# Patient Record
Sex: Female | Born: 1945
Health system: Southern US, Community
[De-identification: ages and names within clinical notes are randomized; demographics above are authoritative.]

## PROBLEM LIST (undated history)

## (undated) DIAGNOSIS — M25511 Pain in right shoulder: Secondary | ICD-10-CM

## (undated) DIAGNOSIS — T753XXA Motion sickness, initial encounter: Secondary | ICD-10-CM

## (undated) DIAGNOSIS — M1990 Unspecified osteoarthritis, unspecified site: Secondary | ICD-10-CM

## (undated) DIAGNOSIS — C801 Malignant (primary) neoplasm, unspecified: Secondary | ICD-10-CM

## (undated) DIAGNOSIS — R42 Dizziness and giddiness: Secondary | ICD-10-CM

## (undated) DIAGNOSIS — K219 Gastro-esophageal reflux disease without esophagitis: Secondary | ICD-10-CM

## (undated) DIAGNOSIS — M810 Age-related osteoporosis without current pathological fracture: Secondary | ICD-10-CM

## (undated) HISTORY — PX: FOOT SURGERY: SHX648

## (undated) HISTORY — DX: Malignant (primary) neoplasm, unspecified: C80.1

## (undated) HISTORY — PX: ABDOMINAL HYSTERECTOMY: SHX81

## (undated) HISTORY — PX: TONSILLECTOMY AND ADENOIDECTOMY: SHX28

## (undated) HISTORY — PX: EYE SURGERY: SHX253

---

## 1979-03-24 HISTORY — PX: AMPUTATION TOE: SHX6595

## 1998-10-14 ENCOUNTER — Ambulatory Visit (HOSPITAL_COMMUNITY): Admission: RE | Admit: 1998-10-14 | Discharge: 1998-10-14 | Payer: Self-pay | Admitting: Gastroenterology

## 1999-10-19 ENCOUNTER — Encounter: Admission: RE | Admit: 1999-10-19 | Discharge: 1999-10-19 | Payer: Self-pay | Admitting: *Deleted

## 2000-01-12 ENCOUNTER — Encounter: Payer: Self-pay | Admitting: Family Medicine

## 2000-01-12 ENCOUNTER — Encounter: Admission: RE | Admit: 2000-01-12 | Discharge: 2000-01-12 | Payer: Self-pay | Admitting: Family Medicine

## 2001-01-14 ENCOUNTER — Encounter: Payer: Self-pay | Admitting: Family Medicine

## 2001-01-14 ENCOUNTER — Encounter: Admission: RE | Admit: 2001-01-14 | Discharge: 2001-01-14 | Payer: Self-pay | Admitting: Family Medicine

## 2001-02-06 ENCOUNTER — Other Ambulatory Visit: Admission: RE | Admit: 2001-02-06 | Discharge: 2001-02-06 | Payer: Self-pay | Admitting: Obstetrics and Gynecology

## 2002-01-16 ENCOUNTER — Encounter: Payer: Self-pay | Admitting: Family Medicine

## 2002-01-16 ENCOUNTER — Encounter: Admission: RE | Admit: 2002-01-16 | Discharge: 2002-01-16 | Payer: Self-pay | Admitting: Family Medicine

## 2002-02-03 ENCOUNTER — Other Ambulatory Visit: Admission: RE | Admit: 2002-02-03 | Discharge: 2002-02-03 | Payer: Self-pay | Admitting: Obstetrics and Gynecology

## 2003-01-22 ENCOUNTER — Encounter: Payer: Self-pay | Admitting: Family Medicine

## 2003-01-22 ENCOUNTER — Encounter: Admission: RE | Admit: 2003-01-22 | Discharge: 2003-01-22 | Payer: Self-pay | Admitting: Family Medicine

## 2003-04-28 ENCOUNTER — Observation Stay (HOSPITAL_COMMUNITY): Admission: EM | Admit: 2003-04-28 | Discharge: 2003-04-29 | Payer: Self-pay

## 2003-04-29 ENCOUNTER — Encounter: Payer: Self-pay | Admitting: Interventional Cardiology

## 2004-02-07 ENCOUNTER — Encounter: Admission: RE | Admit: 2004-02-07 | Discharge: 2004-02-07 | Payer: Self-pay | Admitting: Cardiology

## 2005-02-23 ENCOUNTER — Encounter: Admission: RE | Admit: 2005-02-23 | Discharge: 2005-02-23 | Payer: Self-pay | Admitting: Family Medicine

## 2006-02-26 ENCOUNTER — Encounter: Admission: RE | Admit: 2006-02-26 | Discharge: 2006-02-26 | Payer: Self-pay | Admitting: Family Medicine

## 2007-03-03 ENCOUNTER — Encounter: Admission: RE | Admit: 2007-03-03 | Discharge: 2007-03-03 | Payer: Self-pay | Admitting: Family Medicine

## 2008-03-02 ENCOUNTER — Encounter: Admission: RE | Admit: 2008-03-02 | Discharge: 2008-03-02 | Payer: Self-pay | Admitting: Family Medicine

## 2008-03-11 ENCOUNTER — Encounter: Admission: RE | Admit: 2008-03-11 | Discharge: 2008-03-11 | Payer: Self-pay | Admitting: Obstetrics & Gynecology

## 2009-03-14 ENCOUNTER — Encounter: Admission: RE | Admit: 2009-03-14 | Discharge: 2009-03-14 | Payer: Self-pay | Admitting: Family Medicine

## 2009-07-08 ENCOUNTER — Encounter: Admission: RE | Admit: 2009-07-08 | Discharge: 2009-07-08 | Payer: Self-pay | Admitting: Podiatry

## 2010-04-13 ENCOUNTER — Encounter: Admission: RE | Admit: 2010-04-13 | Discharge: 2010-04-13 | Payer: Self-pay | Admitting: Family Medicine

## 2010-12-08 NOTE — Discharge Summary (Signed)
NAMEMAYLEE, BARE                            ACCOUNT NO.:  [id number]   MEDICAL RECORD NO.:  [id number]                   PATIENT TYPE:  INP   LOCATION:  2038                                 FACILITY:  [facility]   PHYSICIAN:  Meade Maw, M.D.                 DATE OF BIRTH:  [birth year]   DATE OF ADMISSION:  04/28/2003  DATE OF DISCHARGE:  04/29/2003                                 DISCHARGE SUMMARY   CHIEF COMPLAINT:  Chest pain.   ADMISSION DIAGNOSES:  1. Chest pain, rule out myocardial infarction.  2. Hypertension.   DISCHARGE DIAGNOSES:  1. Chest pain, non-cardiac, negative for myocardial infarction.  2. Hypertension, controlled.   HISTORY OF PRESENT ILLNESS:  Please see complete H&P for details, but in  short, this is a 65 year old female with no known history of CAD previously.  She presented to the emergency room on April 28, 2003 with a complaint of  substernal chest pressure that radiated up into both sides of her jaw.  The  pain persisted and she presented to the emergency room for further treatment  and evaluation.   PHYSICAL EXAM ON ADMISSION:  Please see complete H&P.  Vital signs were  stable, with the exception of mildly elevated blood pressure at 160/70.  EKG  showed a normal sinus rhythm with no ST or T wave changes.  Cardiac markers  in the emergency room were negative x2 sets.  She was afebrile.  She was  pain-free upon admission.   HOSPITAL COURSE:  Repeat serial cardiac enzymes were all negative.  EKG had  no acute changes.  She underwent a stress Cardiolite study on April 29, 2003 which showed no ischemia and normal LV function with an EF of 74%.  Her  vital signs remained stable and she had no further complaints of chest  discomfort during this admission.  She was discharged home on April 29, 2003 without further problems.   DISCHARGE MEDICATIONS:  1. Actonel at her previous dose.  2. B12 injections monthly.  3. Claritin as needed (p.r.n.).  4. Flonase p.r.n.  5. Prilosec 20 mg daily.  6. Aspirin 81 mg daily.   ACTIVITY:  Activity as tolerated with no restrictions.   DIET:  She is to maintain a low-fat, low-cholesterol diet.  She was  instructed not to eat less than two hours before bedtime.    FOLLOWUP:  She is to make an appointment to see Dr. Caryn Bee L. Little for  followup in approximately one to two weeks.  She may return to see Dr. Meade Maw on a p.r.n. basis.       Adrian Saran, N.P.                        Meade Maw, M.D.    HB/MEDQ  D:  04/29/2003  T:  04/30/2003  Job:  [id number]  

## 2011-04-02 ENCOUNTER — Other Ambulatory Visit: Payer: Self-pay | Admitting: Family Medicine

## 2011-04-02 DIAGNOSIS — Z1231 Encounter for screening mammogram for malignant neoplasm of breast: Secondary | ICD-10-CM

## 2011-04-24 ENCOUNTER — Ambulatory Visit
Admission: RE | Admit: 2011-04-24 | Discharge: 2011-04-24 | Disposition: A | Discharge status: Home / Self Care | Payer: BC Managed Care – PPO | Source: Ambulatory Visit | Attending: Family Medicine | Admitting: Family Medicine

## 2011-04-24 DIAGNOSIS — Z1231 Encounter for screening mammogram for malignant neoplasm of breast: Secondary | ICD-10-CM

## 2012-03-18 ENCOUNTER — Other Ambulatory Visit: Payer: Self-pay | Admitting: Family Medicine

## 2012-03-18 DIAGNOSIS — Z1231 Encounter for screening mammogram for malignant neoplasm of breast: Secondary | ICD-10-CM

## 2012-03-18 DIAGNOSIS — M810 Age-related osteoporosis without current pathological fracture: Secondary | ICD-10-CM

## 2012-04-29 ENCOUNTER — Ambulatory Visit
Admission: RE | Admit: 2012-04-29 | Discharge: 2012-04-29 | Disposition: A | Discharge status: Home / Self Care | Payer: BC Managed Care – PPO | Source: Ambulatory Visit | Attending: Family Medicine | Admitting: Family Medicine

## 2012-04-29 ENCOUNTER — Ambulatory Visit: Payer: BC Managed Care – PPO

## 2012-04-29 DIAGNOSIS — Z1231 Encounter for screening mammogram for malignant neoplasm of breast: Secondary | ICD-10-CM

## 2012-04-29 DIAGNOSIS — M810 Age-related osteoporosis without current pathological fracture: Secondary | ICD-10-CM

## 2012-08-01 DIAGNOSIS — D649 Anemia, unspecified: Secondary | ICD-10-CM | POA: Diagnosis not present

## 2012-08-01 DIAGNOSIS — Z79899 Other long term (current) drug therapy: Secondary | ICD-10-CM | POA: Diagnosis not present

## 2012-08-01 DIAGNOSIS — E785 Hyperlipidemia, unspecified: Secondary | ICD-10-CM | POA: Diagnosis not present

## 2012-08-01 DIAGNOSIS — M810 Age-related osteoporosis without current pathological fracture: Secondary | ICD-10-CM | POA: Diagnosis not present

## 2012-08-01 DIAGNOSIS — D510 Vitamin B12 deficiency anemia due to intrinsic factor deficiency: Secondary | ICD-10-CM | POA: Diagnosis not present

## 2012-08-07 DIAGNOSIS — L600 Ingrowing nail: Secondary | ICD-10-CM | POA: Diagnosis not present

## 2012-08-19 DIAGNOSIS — Z79899 Other long term (current) drug therapy: Secondary | ICD-10-CM | POA: Diagnosis not present

## 2012-08-19 DIAGNOSIS — M810 Age-related osteoporosis without current pathological fracture: Secondary | ICD-10-CM | POA: Diagnosis not present

## 2012-08-19 DIAGNOSIS — E785 Hyperlipidemia, unspecified: Secondary | ICD-10-CM | POA: Diagnosis not present

## 2012-08-19 DIAGNOSIS — D510 Vitamin B12 deficiency anemia due to intrinsic factor deficiency: Secondary | ICD-10-CM | POA: Diagnosis not present

## 2012-08-19 DIAGNOSIS — Z23 Encounter for immunization: Secondary | ICD-10-CM | POA: Diagnosis not present

## 2012-08-19 DIAGNOSIS — D649 Anemia, unspecified: Secondary | ICD-10-CM | POA: Diagnosis not present

## 2012-10-08 DIAGNOSIS — R609 Edema, unspecified: Secondary | ICD-10-CM | POA: Diagnosis not present

## 2012-10-08 DIAGNOSIS — L820 Inflamed seborrheic keratosis: Secondary | ICD-10-CM | POA: Diagnosis not present

## 2012-10-08 DIAGNOSIS — IMO0002 Reserved for concepts with insufficient information to code with codable children: Secondary | ICD-10-CM | POA: Diagnosis not present

## 2012-11-03 DIAGNOSIS — L03039 Cellulitis of unspecified toe: Secondary | ICD-10-CM | POA: Diagnosis not present

## 2012-11-24 DIAGNOSIS — L820 Inflamed seborrheic keratosis: Secondary | ICD-10-CM | POA: Diagnosis not present

## 2013-03-30 DIAGNOSIS — L538 Other specified erythematous conditions: Secondary | ICD-10-CM | POA: Diagnosis not present

## 2013-03-30 DIAGNOSIS — Z85828 Personal history of other malignant neoplasm of skin: Secondary | ICD-10-CM | POA: Diagnosis not present

## 2013-03-30 DIAGNOSIS — L570 Actinic keratosis: Secondary | ICD-10-CM | POA: Diagnosis not present

## 2013-03-30 DIAGNOSIS — D235 Other benign neoplasm of skin of trunk: Secondary | ICD-10-CM | POA: Diagnosis not present

## 2013-04-10 DIAGNOSIS — Z23 Encounter for immunization: Secondary | ICD-10-CM | POA: Diagnosis not present

## 2013-05-14 ENCOUNTER — Other Ambulatory Visit: Payer: Self-pay

## 2013-05-14 DIAGNOSIS — Z1231 Encounter for screening mammogram for malignant neoplasm of breast: Secondary | ICD-10-CM

## 2013-05-14 DIAGNOSIS — Z124 Encounter for screening for malignant neoplasm of cervix: Secondary | ICD-10-CM | POA: Diagnosis not present

## 2013-06-15 ENCOUNTER — Ambulatory Visit
Admission: RE | Admit: 2013-06-15 | Discharge: 2013-06-15 | Disposition: A | Discharge status: Home / Self Care | Payer: Medicare Other | Source: Ambulatory Visit

## 2013-06-15 DIAGNOSIS — Z1231 Encounter for screening mammogram for malignant neoplasm of breast: Secondary | ICD-10-CM | POA: Diagnosis not present

## 2013-06-17 DIAGNOSIS — H01009 Unspecified blepharitis unspecified eye, unspecified eyelid: Secondary | ICD-10-CM | POA: Diagnosis not present

## 2013-08-19 DIAGNOSIS — L820 Inflamed seborrheic keratosis: Secondary | ICD-10-CM | POA: Diagnosis not present

## 2013-08-19 DIAGNOSIS — I781 Nevus, non-neoplastic: Secondary | ICD-10-CM | POA: Diagnosis not present

## 2013-08-19 DIAGNOSIS — L570 Actinic keratosis: Secondary | ICD-10-CM | POA: Diagnosis not present

## 2013-09-04 DIAGNOSIS — Z1331 Encounter for screening for depression: Secondary | ICD-10-CM | POA: Diagnosis not present

## 2013-09-04 DIAGNOSIS — D510 Vitamin B12 deficiency anemia due to intrinsic factor deficiency: Secondary | ICD-10-CM | POA: Diagnosis not present

## 2013-09-04 DIAGNOSIS — Z79899 Other long term (current) drug therapy: Secondary | ICD-10-CM | POA: Diagnosis not present

## 2013-09-04 DIAGNOSIS — E785 Hyperlipidemia, unspecified: Secondary | ICD-10-CM | POA: Diagnosis not present

## 2013-09-04 DIAGNOSIS — Z23 Encounter for immunization: Secondary | ICD-10-CM | POA: Diagnosis not present

## 2013-09-04 DIAGNOSIS — M810 Age-related osteoporosis without current pathological fracture: Secondary | ICD-10-CM | POA: Diagnosis not present

## 2014-01-06 DIAGNOSIS — L570 Actinic keratosis: Secondary | ICD-10-CM | POA: Diagnosis not present

## 2014-01-06 DIAGNOSIS — L820 Inflamed seborrheic keratosis: Secondary | ICD-10-CM | POA: Diagnosis not present

## 2014-02-05 DIAGNOSIS — M67919 Unspecified disorder of synovium and tendon, unspecified shoulder: Secondary | ICD-10-CM | POA: Diagnosis not present

## 2014-02-05 DIAGNOSIS — M7541 Impingement syndrome of right shoulder: Secondary | ICD-10-CM | POA: Insufficient documentation

## 2014-02-05 DIAGNOSIS — M25519 Pain in unspecified shoulder: Secondary | ICD-10-CM | POA: Diagnosis not present

## 2014-02-11 DIAGNOSIS — M25519 Pain in unspecified shoulder: Secondary | ICD-10-CM | POA: Diagnosis not present

## 2014-02-15 DIAGNOSIS — M25519 Pain in unspecified shoulder: Secondary | ICD-10-CM | POA: Diagnosis not present

## 2014-02-18 DIAGNOSIS — M25519 Pain in unspecified shoulder: Secondary | ICD-10-CM | POA: Diagnosis not present

## 2014-03-01 DIAGNOSIS — M25519 Pain in unspecified shoulder: Secondary | ICD-10-CM | POA: Diagnosis not present

## 2014-03-04 DIAGNOSIS — M25519 Pain in unspecified shoulder: Secondary | ICD-10-CM | POA: Diagnosis not present

## 2014-03-08 DIAGNOSIS — M25519 Pain in unspecified shoulder: Secondary | ICD-10-CM | POA: Diagnosis not present

## 2014-03-10 DIAGNOSIS — M25519 Pain in unspecified shoulder: Secondary | ICD-10-CM | POA: Diagnosis not present

## 2014-03-26 DIAGNOSIS — M67919 Unspecified disorder of synovium and tendon, unspecified shoulder: Secondary | ICD-10-CM | POA: Diagnosis not present

## 2014-03-26 DIAGNOSIS — M719 Bursopathy, unspecified: Secondary | ICD-10-CM | POA: Diagnosis not present

## 2014-04-26 DIAGNOSIS — L820 Inflamed seborrheic keratosis: Secondary | ICD-10-CM | POA: Diagnosis not present

## 2014-04-26 DIAGNOSIS — L570 Actinic keratosis: Secondary | ICD-10-CM | POA: Diagnosis not present

## 2014-04-26 DIAGNOSIS — X32XXXD Exposure to sunlight, subsequent encounter: Secondary | ICD-10-CM | POA: Diagnosis not present

## 2014-04-29 DIAGNOSIS — Z23 Encounter for immunization: Secondary | ICD-10-CM | POA: Diagnosis not present

## 2014-05-10 ENCOUNTER — Other Ambulatory Visit: Payer: Self-pay

## 2014-05-10 DIAGNOSIS — Z1231 Encounter for screening mammogram for malignant neoplasm of breast: Secondary | ICD-10-CM

## 2014-05-11 ENCOUNTER — Other Ambulatory Visit: Payer: Self-pay | Admitting: Family Medicine

## 2014-05-11 DIAGNOSIS — M810 Age-related osteoporosis without current pathological fracture: Secondary | ICD-10-CM

## 2014-05-25 DIAGNOSIS — Z124 Encounter for screening for malignant neoplasm of cervix: Secondary | ICD-10-CM | POA: Diagnosis not present

## 2014-05-25 DIAGNOSIS — Z01419 Encounter for gynecological examination (general) (routine) without abnormal findings: Secondary | ICD-10-CM | POA: Diagnosis not present

## 2014-06-21 DIAGNOSIS — H2513 Age-related nuclear cataract, bilateral: Secondary | ICD-10-CM | POA: Diagnosis not present

## 2014-06-22 ENCOUNTER — Ambulatory Visit
Admission: RE | Admit: 2014-06-22 | Discharge: 2014-06-22 | Disposition: A | Discharge status: Home / Self Care | Payer: Medicare Other | Source: Ambulatory Visit | Attending: Family Medicine | Admitting: Family Medicine

## 2014-06-22 ENCOUNTER — Ambulatory Visit
Admission: RE | Admit: 2014-06-22 | Discharge: 2014-06-22 | Disposition: A | Discharge status: Home / Self Care | Payer: Medicare Other | Source: Ambulatory Visit

## 2014-06-22 DIAGNOSIS — M810 Age-related osteoporosis without current pathological fracture: Secondary | ICD-10-CM | POA: Diagnosis not present

## 2014-06-22 DIAGNOSIS — Z1231 Encounter for screening mammogram for malignant neoplasm of breast: Secondary | ICD-10-CM | POA: Diagnosis not present

## 2014-06-22 DIAGNOSIS — Z780 Asymptomatic menopausal state: Secondary | ICD-10-CM | POA: Diagnosis not present

## 2014-06-30 DIAGNOSIS — L820 Inflamed seborrheic keratosis: Secondary | ICD-10-CM | POA: Diagnosis not present

## 2014-06-30 DIAGNOSIS — L723 Sebaceous cyst: Secondary | ICD-10-CM | POA: Diagnosis not present

## 2014-07-21 DIAGNOSIS — L820 Inflamed seborrheic keratosis: Secondary | ICD-10-CM | POA: Diagnosis not present

## 2014-07-23 DIAGNOSIS — C801 Malignant (primary) neoplasm, unspecified: Secondary | ICD-10-CM

## 2014-07-23 HISTORY — DX: Malignant (primary) neoplasm, unspecified: C80.1

## 2014-09-16 DIAGNOSIS — C44109 Unspecified malignant neoplasm of skin of left eyelid, including canthus: Secondary | ICD-10-CM | POA: Diagnosis not present

## 2014-09-22 DIAGNOSIS — L820 Inflamed seborrheic keratosis: Secondary | ICD-10-CM | POA: Diagnosis not present

## 2014-11-19 DIAGNOSIS — M19041 Primary osteoarthritis, right hand: Secondary | ICD-10-CM | POA: Diagnosis not present

## 2014-11-19 DIAGNOSIS — M7541 Impingement syndrome of right shoulder: Secondary | ICD-10-CM | POA: Diagnosis not present

## 2014-11-19 DIAGNOSIS — M25531 Pain in right wrist: Secondary | ICD-10-CM | POA: Diagnosis not present

## 2014-11-19 DIAGNOSIS — M25511 Pain in right shoulder: Secondary | ICD-10-CM | POA: Diagnosis not present

## 2014-11-25 DIAGNOSIS — C44109 Unspecified malignant neoplasm of skin of left eyelid, including canthus: Secondary | ICD-10-CM | POA: Diagnosis not present

## 2014-11-25 DIAGNOSIS — C44119 Basal cell carcinoma of skin of left eyelid, including canthus: Secondary | ICD-10-CM | POA: Diagnosis not present

## 2014-12-08 DIAGNOSIS — L570 Actinic keratosis: Secondary | ICD-10-CM | POA: Diagnosis not present

## 2014-12-08 DIAGNOSIS — R0609 Other forms of dyspnea: Secondary | ICD-10-CM | POA: Diagnosis not present

## 2014-12-08 DIAGNOSIS — R5383 Other fatigue: Secondary | ICD-10-CM | POA: Diagnosis not present

## 2014-12-08 DIAGNOSIS — Z1283 Encounter for screening for malignant neoplasm of skin: Secondary | ICD-10-CM | POA: Diagnosis not present

## 2014-12-08 DIAGNOSIS — M810 Age-related osteoporosis without current pathological fracture: Secondary | ICD-10-CM | POA: Diagnosis not present

## 2014-12-08 DIAGNOSIS — N289 Disorder of kidney and ureter, unspecified: Secondary | ICD-10-CM | POA: Diagnosis not present

## 2014-12-08 DIAGNOSIS — E785 Hyperlipidemia, unspecified: Secondary | ICD-10-CM | POA: Diagnosis not present

## 2014-12-08 DIAGNOSIS — X32XXXD Exposure to sunlight, subsequent encounter: Secondary | ICD-10-CM | POA: Diagnosis not present

## 2014-12-15 ENCOUNTER — Encounter: Payer: Self-pay | Admitting: Podiatry

## 2014-12-15 ENCOUNTER — Ambulatory Visit (INDEPENDENT_AMBULATORY_CARE_PROVIDER_SITE_OTHER): Payer: Medicare Other

## 2014-12-15 ENCOUNTER — Ambulatory Visit (INDEPENDENT_AMBULATORY_CARE_PROVIDER_SITE_OTHER): Payer: Medicare Other | Admitting: Podiatry

## 2014-12-15 VITALS — BP 112/54 | HR 78 | Resp 11 | Ht 68.0 in | Wt 160.0 lb

## 2014-12-15 DIAGNOSIS — L600 Ingrowing nail: Secondary | ICD-10-CM

## 2014-12-15 DIAGNOSIS — M21612 Bunion of left foot: Secondary | ICD-10-CM

## 2014-12-15 DIAGNOSIS — M2012 Hallux valgus (acquired), left foot: Secondary | ICD-10-CM

## 2014-12-15 DIAGNOSIS — M779 Enthesopathy, unspecified: Secondary | ICD-10-CM | POA: Diagnosis not present

## 2014-12-15 MED ORDER — TRIAMCINOLONE ACETONIDE 10 MG/ML IJ SUSP
10.0000 mg | Freq: Once | INTRAMUSCULAR | Status: AC
  Administered 2014-12-15: 10 mg

## 2014-12-15 NOTE — Progress Notes (Signed)
   Subjective:    Patient ID: Kelly Keith, female    DOB: March 22, 1946, 69 y.o.   MRN: [id number]  HPI    Review of Systems  Eyes:       Left lower eye lid cancerous biopsy  Genitourinary:       Current bloodwork showed abnormal kidney function and pt states she is to avoid ANSAIDS.  All other systems reviewed and are negative.      Objective:   Physical Exam        Assessment & Plan:

## 2014-12-15 NOTE — Patient Instructions (Signed)

## 2014-12-16 NOTE — Progress Notes (Signed)
Subjective:     Patient ID: Kelly Keith, female   DOB: 10-Apr-1946, 69 y.o.   MRN: [id number]  HPI patient presents stating she's getting pain in her left big toe and the joint and she has severely damaged and painful hallux and second nailbeds left that are abnormally growing thick and impossible for her to cut   Review of Systems  All other systems reviewed and are negative.      Objective:   Physical Exam  Constitutional: She is oriented to person, place, and time.  Cardiovascular: Intact distal pulses.   Musculoskeletal: Normal range of motion.  Neurological: She is oriented to person, place, and time.  Skin: Skin is warm.  Nursing note and vitals reviewed.  neurovascular status intact muscle strength adequate with range of motion within normal limits. Patient's found to have good digital perfusion is well oriented 3 and has severe damage to the left hallux and second nailbeds with inflammation of the interphalangeal joint of the left first hallux. There is a mild amount of fluid buildup but no crepitus upon motion of the interphalangeal joint     Assessment:     Damaged nailbeds hallux and second left with thickness and dystrophic changes with pain and inner phalangeal joint inflammation pain left hallux    Plan:     H&P and conditions reviewed with patient. At this point due to the severe nature of the nailbeds are recommended removal of the permanent nature and explained procedure and risk. Patient wants surgery and today I infiltrated the hallux and second toe with 120 mg Xylocaine Marcaine mixture I then went ahead and did a interphalangeal joint injection left and then remove the hallux and second nails exposed matrix and applied phenol 5 applications 30 seconds followed by alcohol lavage and sterile dressing. Gave instructions on soaks and reappoint

## 2014-12-21 ENCOUNTER — Encounter: Payer: Self-pay | Admitting: Podiatry

## 2014-12-21 ENCOUNTER — Ambulatory Visit (INDEPENDENT_AMBULATORY_CARE_PROVIDER_SITE_OTHER): Payer: Medicare Other | Admitting: Podiatry

## 2014-12-21 VITALS — BP 126/82 | HR 70 | Resp 12

## 2014-12-21 DIAGNOSIS — L03012 Cellulitis of left finger: Secondary | ICD-10-CM

## 2014-12-21 MED ORDER — CEPHALEXIN 500 MG PO CAPS: 500.0000 mg | ORAL_CAPSULE | Freq: Two times a day (BID) | ORAL | Status: DC

## 2014-12-21 NOTE — Progress Notes (Signed)
Subjective:     Patient ID: Kelly Keith, female   DOB: 11-03-1945, 69 y.o.   MRN: [id number]  HPI patient presents stating that these left hallux and second toe are draining and they're painful when pressed   Review of Systems     Objective:   Physical Exam Neurovascular status intact with health history normal and mild drainage on the hallux and left second nail bed that's localized in nature    Assessment:     Paronychia infection left hallux second nail that's localized in nature with no indications of proximal edema erythema or drainage    Plan:     Reviewed condition and at this time I've recommended continued soaks and his precautionary measure placed patient on cephalexin 500 mg twice a day and patient will call if any other issues were to occur

## 2014-12-21 NOTE — Progress Notes (Signed)
   Subjective:    Patient ID: Kelly Keith, female    DOB: 23-Oct-1945, 69 y.o.   MRN: [id number]  HPI  Patient has inflammation, redness and throbbing pain on Left first 2 toes. Particularly in entire areas surrounding the nail.  Review of Systems     Objective:   Physical Exam        Assessment & Plan:

## 2014-12-24 DIAGNOSIS — E785 Hyperlipidemia, unspecified: Secondary | ICD-10-CM | POA: Diagnosis not present

## 2014-12-24 DIAGNOSIS — R0602 Shortness of breath: Secondary | ICD-10-CM | POA: Diagnosis not present

## 2015-01-18 DIAGNOSIS — R0600 Dyspnea, unspecified: Secondary | ICD-10-CM | POA: Diagnosis not present

## 2015-01-21 DIAGNOSIS — R0609 Other forms of dyspnea: Secondary | ICD-10-CM | POA: Diagnosis not present

## 2015-02-12 DIAGNOSIS — N390 Urinary tract infection, site not specified: Secondary | ICD-10-CM | POA: Diagnosis not present

## 2015-04-20 ENCOUNTER — Other Ambulatory Visit: Payer: Self-pay | Admitting: Physician Assistant

## 2015-04-20 DIAGNOSIS — M7541 Impingement syndrome of right shoulder: Secondary | ICD-10-CM

## 2015-04-26 ENCOUNTER — Ambulatory Visit: Admission: RE | Admit: 2015-04-26 | Payer: Medicare Other | Source: Ambulatory Visit | Admitting: Ophthalmology

## 2015-04-26 ENCOUNTER — Encounter: Admission: RE | Payer: Self-pay | Source: Ambulatory Visit

## 2015-04-26 SURGERY — BLEPHAROPLASTY
Anesthesia: IV Sedation (MBSC Only) | Laterality: Bilateral

## 2015-04-28 ENCOUNTER — Ambulatory Visit
Admission: RE | Admit: 2015-04-28 | Discharge: 2015-04-28 | Disposition: A | Discharge status: Home / Self Care | Payer: Medicare Other | Source: Ambulatory Visit | Attending: Physician Assistant | Admitting: Physician Assistant

## 2015-04-28 DIAGNOSIS — M7541 Impingement syndrome of right shoulder: Secondary | ICD-10-CM | POA: Diagnosis not present

## 2015-04-28 DIAGNOSIS — M7581 Other shoulder lesions, right shoulder: Secondary | ICD-10-CM | POA: Diagnosis not present

## 2015-04-28 DIAGNOSIS — M25511 Pain in right shoulder: Secondary | ICD-10-CM | POA: Diagnosis not present

## 2015-05-11 DIAGNOSIS — L820 Inflamed seborrheic keratosis: Secondary | ICD-10-CM | POA: Diagnosis not present

## 2015-05-20 DIAGNOSIS — M47812 Spondylosis without myelopathy or radiculopathy, cervical region: Secondary | ICD-10-CM | POA: Insufficient documentation

## 2015-05-20 DIAGNOSIS — M7541 Impingement syndrome of right shoulder: Secondary | ICD-10-CM | POA: Diagnosis not present

## 2015-05-20 DIAGNOSIS — M7581 Other shoulder lesions, right shoulder: Secondary | ICD-10-CM | POA: Diagnosis not present

## 2015-05-30 DIAGNOSIS — M542 Cervicalgia: Secondary | ICD-10-CM | POA: Diagnosis not present

## 2015-06-01 DIAGNOSIS — M542 Cervicalgia: Secondary | ICD-10-CM | POA: Diagnosis not present

## 2015-06-13 DIAGNOSIS — Z01419 Encounter for gynecological examination (general) (routine) without abnormal findings: Secondary | ICD-10-CM | POA: Diagnosis not present

## 2015-06-13 DIAGNOSIS — Z124 Encounter for screening for malignant neoplasm of cervix: Secondary | ICD-10-CM | POA: Diagnosis not present

## 2015-06-13 DIAGNOSIS — Z23 Encounter for immunization: Secondary | ICD-10-CM | POA: Diagnosis not present

## 2015-06-13 DIAGNOSIS — Z1231 Encounter for screening mammogram for malignant neoplasm of breast: Secondary | ICD-10-CM | POA: Diagnosis not present

## 2015-06-14 DIAGNOSIS — M542 Cervicalgia: Secondary | ICD-10-CM | POA: Diagnosis not present

## 2015-06-20 DIAGNOSIS — H2513 Age-related nuclear cataract, bilateral: Secondary | ICD-10-CM | POA: Diagnosis not present

## 2015-06-21 DIAGNOSIS — M542 Cervicalgia: Secondary | ICD-10-CM | POA: Diagnosis not present

## 2015-06-23 DIAGNOSIS — M542 Cervicalgia: Secondary | ICD-10-CM | POA: Diagnosis not present

## 2015-07-01 DIAGNOSIS — M542 Cervicalgia: Secondary | ICD-10-CM | POA: Diagnosis not present

## 2015-08-10 DIAGNOSIS — L820 Inflamed seborrheic keratosis: Secondary | ICD-10-CM | POA: Diagnosis not present

## 2015-08-22 ENCOUNTER — Encounter: Payer: Self-pay | Admitting: *Deleted

## 2015-08-29 NOTE — Discharge Instructions (Signed)
INSTRUCTIONS FOLLOWING OCULOPLASTIC SURGERY °AMY M. FOWLER, MD ° °AFTER YOUR EYE SURGERY, THER ARE MANY THINGS THWIHC YOU, THE PATIENT, CAN DO TO ASSURE THE BEST POSSIBLE RESULT FROM YOUR OPERATION.  THIS SHEET SHOULD BE REFERRED TO WHENEVER QUESTIONS ARISE.  IF THERE ARE ANY QUESTIONS NOT ANSWERED HERE, DO NOT HESITATE TO CALL OUR OFFICE AT [phone] OR [phone].  THERE IS ALWAYS [facility] AVAILABLE TO CALL IF QUESTIONS OR PROBLEMS ARISE. ° °VISION: Your vision may be blurred and out of focus after surgery until you are able to stop using your ointment, swelling resolves and your eye(s) heal. This may take 1 to 2 weeks at the least.  If your vision becomes gradually more dim or dark, this is not normal and you need to call our office immediately. ° °EYE CARE: For the first 48 hours after surgery, use ice packs frequently - “20 minutes on, 20 minutes off” - to help reduce swelling and bruising.  Small bags of frozen peas or corn make good ice packs along with cloths soaked in ice water.  If you are wearing a patch or other type of dressing following surgery, keep this on for the amount of time specified by your doctor.  For the first week following surgery, you will need to treat your stitches with great care.  If is OK to shower, but take care to not allow soapy water to run into your eye(s) to help reduce changes of infection.  You may gently clean the eyelashes and around the eye(s) with cotton balls and sterile water, BUT DO NOT RUB THE STITCHES VIGOROUSLY.  Keeping your stitches moist with ointment will help promote healing with minimal scar formation. ° °ACTIVITY: When you leave the surgery center, you should go home, rest and be inactive.  The eye(s) may feel scratchy and keeping the eyes closed will allow for faster healing.  The first week following surgery, avoid straining (anything making the face turn red) or lifting over 20 pounds.  Additionally, avoid bending which causes your head to go below  your waist.  Using your eyes will NOT harm them, so feel free to read, watch television, use the computer, etc as desired.  Driving depends on each individual, so check with your doctor if you have questions about driving. ° °MEDICATIONS:  You will be given a prescription for an ointment to use 4 times a day on your stitches.  You can use the ointment in your eyes if they feel scratchy or irritated.  If you eyelid(s) don’t close completely when you sleep, put some ointment in your eyes before bedtime. ° °EMERGENCY: If you experience SEVERE EYE PAIN OR HEADACHE UNRELIEVED BY TYLENOL OR PERCOCET, NAUSEA OR VOMITING, WORSENING REDNESS, OR WORSENING VISION (ESPECIALLY VISION THAT WA INITIALLY BETTER) CALL [phone] OR [phone] DURING BUSINESS HOURS OR AFTER HOURS. ° °General Anesthesia, Adult, Care After °Refer to this sheet in the next few weeks. These instructions provide you with information on caring for yourself after your procedure. Your health care provider may also give you more specific instructions. Your treatment has been planned according to current medical practices, but problems sometimes occur. Call your health care provider if you have any problems or questions after your procedure. °WHAT TO EXPECT AFTER THE PROCEDURE °After the procedure, it is typical to experience: °· Sleepiness. °· Nausea and vomiting. °HOME CARE INSTRUCTIONS °· For the first 24 hours after general anesthesia: °¨ Have a responsible person with you. °¨ Do not drive a car. If you   are alone, do not take public transportation. °¨ Do not drink alcohol. °¨ Do not take medicine that has not been prescribed by your health care provider. °¨ Do not sign important papers or make important decisions. °¨ You may resume a normal diet and activities as directed by your health care provider. °· Change bandages (dressings) as directed. °· If you have questions or problems that seem related to general anesthesia, call the hospital and ask for  the anesthetist or anesthesiologist on call. °SEEK MEDICAL CARE IF: °· You have nausea and vomiting that continue the day after anesthesia. °· You develop a rash. °SEEK IMMEDIATE MEDICAL CARE IF:  °· You have difficulty breathing. °· You have chest pain. °· You have any allergic problems. °  °This information is not intended to replace advice given to you by your health care provider. Make sure you discuss any questions you have with your health care provider. °  °Document Released: 10/15/2000 Document Revised: 07/30/2014 Document Reviewed: 11/07/2011 °Elsevier Interactive Patient Education ©2016 [employer] ° °

## 2015-08-30 ENCOUNTER — Encounter
Admission: RE | Disposition: A | Discharge status: Home / Self Care | Payer: Self-pay | Source: Ambulatory Visit | Attending: Ophthalmology

## 2015-08-30 ENCOUNTER — Ambulatory Visit
Admission: RE | Admit: 2015-08-30 | Discharge: 2015-08-30 | Disposition: A | Discharge status: Home / Self Care | Payer: Medicare Other | Source: Ambulatory Visit | Attending: Ophthalmology | Admitting: Ophthalmology

## 2015-08-30 ENCOUNTER — Ambulatory Visit: Payer: Medicare Other | Admitting: Anesthesiology

## 2015-08-30 DIAGNOSIS — H02834 Dermatochalasis of left upper eyelid: Secondary | ICD-10-CM | POA: Diagnosis not present

## 2015-08-30 DIAGNOSIS — Z85828 Personal history of other malignant neoplasm of skin: Secondary | ICD-10-CM | POA: Insufficient documentation

## 2015-08-30 DIAGNOSIS — M810 Age-related osteoporosis without current pathological fracture: Secondary | ICD-10-CM | POA: Insufficient documentation

## 2015-08-30 DIAGNOSIS — Z881 Allergy status to other antibiotic agents status: Secondary | ICD-10-CM | POA: Diagnosis not present

## 2015-08-30 DIAGNOSIS — K219 Gastro-esophageal reflux disease without esophagitis: Secondary | ICD-10-CM | POA: Insufficient documentation

## 2015-08-30 DIAGNOSIS — H02133 Senile ectropion of right eye, unspecified eyelid: Secondary | ICD-10-CM | POA: Diagnosis not present

## 2015-08-30 DIAGNOSIS — M1990 Unspecified osteoarthritis, unspecified site: Secondary | ICD-10-CM | POA: Diagnosis not present

## 2015-08-30 DIAGNOSIS — Z90710 Acquired absence of both cervix and uterus: Secondary | ICD-10-CM | POA: Insufficient documentation

## 2015-08-30 DIAGNOSIS — H02102 Unspecified ectropion of right lower eyelid: Secondary | ICD-10-CM | POA: Diagnosis not present

## 2015-08-30 DIAGNOSIS — H02403 Unspecified ptosis of bilateral eyelids: Secondary | ICD-10-CM | POA: Diagnosis not present

## 2015-08-30 DIAGNOSIS — H02402 Unspecified ptosis of left eyelid: Secondary | ICD-10-CM | POA: Diagnosis not present

## 2015-08-30 DIAGNOSIS — H02831 Dermatochalasis of right upper eyelid: Secondary | ICD-10-CM | POA: Diagnosis not present

## 2015-08-30 DIAGNOSIS — H02401 Unspecified ptosis of right eyelid: Secondary | ICD-10-CM | POA: Diagnosis not present

## 2015-08-30 HISTORY — PX: BROW LIFT: SHX178

## 2015-08-30 HISTORY — DX: Pain in right shoulder: M25.511

## 2015-08-30 HISTORY — PX: PTOSIS REPAIR: SHX6568

## 2015-08-30 HISTORY — DX: Dizziness and giddiness: R42

## 2015-08-30 HISTORY — DX: Motion sickness, initial encounter: T75.3XXA

## 2015-08-30 HISTORY — DX: Unspecified osteoarthritis, unspecified site: M19.90

## 2015-08-30 HISTORY — DX: Gastro-esophageal reflux disease without esophagitis: K21.9

## 2015-08-30 HISTORY — DX: Age-related osteoporosis without current pathological fracture: M81.0

## 2015-08-30 HISTORY — PX: ECTROPION REPAIR: SHX357

## 2015-08-30 SURGERY — BLEPHAROPLASTY
Anesthesia: Monitor Anesthesia Care | Site: Face | Laterality: Right | Wound class: Clean

## 2015-08-30 MED ORDER — MIDAZOLAM HCL 2 MG/2ML IJ SOLN
INTRAMUSCULAR | Status: DC | PRN
  Administered 2015-08-30 (×2): 1 mg via INTRAVENOUS

## 2015-08-30 MED ORDER — LIDOCAINE-EPINEPHRINE 2 %-1:100000 IJ SOLN
INTRAMUSCULAR | Status: DC | PRN
  Administered 2015-08-30: 8 mL via OPHTHALMIC

## 2015-08-30 MED ORDER — OXYCODONE HCL 5 MG/5ML PO SOLN: 5.0000 mg | Freq: Once | ORAL | Status: DC | PRN

## 2015-08-30 MED ORDER — ACETAMINOPHEN 325 MG PO TABS
325.0000 mg | ORAL_TABLET | ORAL | Status: DC | PRN
  Administered 2015-08-30: 325 mg via ORAL

## 2015-08-30 MED ORDER — PROPOFOL 500 MG/50ML IV EMUL
INTRAVENOUS | Status: DC | PRN
  Administered 2015-08-30: 25 ug/kg/min via INTRAVENOUS

## 2015-08-30 MED ORDER — LACTATED RINGERS IV SOLN
INTRAVENOUS | Status: DC
  Administered 2015-08-30: 09:00:00 via INTRAVENOUS

## 2015-08-30 MED ORDER — ERYTHROMYCIN 5 MG/GM OP OINT
TOPICAL_OINTMENT | OPHTHALMIC | Status: DC | PRN
  Administered 2015-08-30: 1 via OPHTHALMIC

## 2015-08-30 MED ORDER — FENTANYL CITRATE (PF) 100 MCG/2ML IJ SOLN: 25.0000 ug | INTRAMUSCULAR | Status: DC | PRN

## 2015-08-30 MED ORDER — LACTATED RINGERS IV SOLN: 500.0000 mL | INTRAVENOUS | Status: DC

## 2015-08-30 MED ORDER — ACETAMINOPHEN 160 MG/5ML PO SOLN: 325.0000 mg | ORAL | Status: DC | PRN

## 2015-08-30 MED ORDER — BACITRACIN 500 UNIT/GM OP OINT: TOPICAL_OINTMENT | OPHTHALMIC | Status: DC

## 2015-08-30 MED ORDER — OXYCODONE HCL 5 MG PO TABS: 5.0000 mg | ORAL_TABLET | Freq: Once | ORAL | Status: DC | PRN

## 2015-08-30 MED ORDER — BSS IO SOLN
INTRAOCULAR | Status: DC | PRN
  Administered 2015-08-30: 5 mL via INTRAOCULAR

## 2015-08-30 MED ORDER — TETRACAINE HCL 0.5 % OP SOLN
OPHTHALMIC | Status: DC | PRN
  Administered 2015-08-30: 3 [drp] via OPHTHALMIC

## 2015-08-30 MED ORDER — ALFENTANIL 500 MCG/ML IJ INJ
INJECTION | INTRAMUSCULAR | Status: DC | PRN
  Administered 2015-08-30: 500 ug via INTRAVENOUS
  Administered 2015-08-30 (×2): 250 ug via INTRAVENOUS

## 2015-08-30 MED ORDER — OXYCODONE-ACETAMINOPHEN 5-325 MG PO TABS: 1.0000 | ORAL_TABLET | ORAL | Status: DC | PRN

## 2015-08-30 MED ORDER — DEXAMETHASONE SODIUM PHOSPHATE 4 MG/ML IJ SOLN: 8.0000 mg | Freq: Once | INTRAMUSCULAR | Status: DC | PRN

## 2015-08-30 SURGICAL SUPPLY — 38 items
APPLICATOR COTTON TIP WD 3 STR (MISCELLANEOUS) ×6 IMPLANT
BLADE SURG 15 STRL LF DISP TIS (BLADE) ×2 IMPLANT
BLADE SURG 15 STRL SS (BLADE) ×3
CORD BIP STRL DISP 12FT (MISCELLANEOUS) ×3 IMPLANT
DRAPE HEAD BAR (DRAPES) ×3 IMPLANT
GAUZE SPONGE 4X4 12PLY STRL (GAUZE/BANDAGES/DRESSINGS) ×3 IMPLANT
GAUZE SPONGE NON-WVN 2X2 STRL (MISCELLANEOUS) ×20 IMPLANT
GLOVE SURG LX 7.0 MICRO (GLOVE) ×2
GLOVE SURG LX STRL 7.0 MICRO (GLOVE) ×4 IMPLANT
MARKER SKIN XFINE TIP W/RULER (MISCELLANEOUS) ×3 IMPLANT
NDL FILTER BLUNT 18X1 1/2 (NEEDLE) ×2 IMPLANT
NDL HYPO 30X.5 LL (NEEDLE) ×4 IMPLANT
NEEDLE FILTER BLUNT 18X 1/2SAF (NEEDLE) ×1
NEEDLE FILTER BLUNT 18X1 1/2 (NEEDLE) ×2 IMPLANT
NEEDLE HYPO 30X.5 LL (NEEDLE) ×6 IMPLANT
PACK DRAPE NASAL/ENT (PACKS) ×3 IMPLANT
SOL PREP PVP 2OZ (MISCELLANEOUS) ×3
SOLUTION PREP PVP 2OZ (MISCELLANEOUS) ×2 IMPLANT
SPONGE VERSALON 2X2 STRL (MISCELLANEOUS) ×30
SUT CHROMIC 4-0 (SUTURE)
SUT CHROMIC 4-0 M2 12X2 ARM (SUTURE)
SUT CHROMIC 5 0 P 3 (SUTURE) IMPLANT
SUT ETHILON 4 0 CL P 3 (SUTURE) IMPLANT
SUT MERSILENE 4-0 S-2 (SUTURE) ×3 IMPLANT
SUT PDS AB 4-0 P3 18 (SUTURE) IMPLANT
SUT PLAIN GUT (SUTURE) ×4 IMPLANT
SUT PROLENE 5 0 P 3 (SUTURE) ×3 IMPLANT
SUT PROLENE 6 0 P 1 18 (SUTURE) ×4 IMPLANT
SUT SILK 4 0 G 3 (SUTURE) IMPLANT
SUT VIC AB 5-0 P-3 18X BRD (SUTURE) IMPLANT
SUT VIC AB 5-0 P3 18 (SUTURE)
SUT VICRYL 6-0  S14 CTD (SUTURE)
SUT VICRYL 6-0 S14 CTD (SUTURE) IMPLANT
SUT VICRYL 7 0 TG140 8 (SUTURE) IMPLANT
SUTURE CHRMC 4-0 M2 12X2 ARM (SUTURE) IMPLANT
SYR 3ML LL SCALE MARK (SYRINGE) ×3 IMPLANT
SYRINGE 10CC LL (SYRINGE) ×3 IMPLANT
WATER STERILE IRR 500ML POUR (IV SOLUTION) ×3 IMPLANT

## 2015-08-30 NOTE — Anesthesia Preprocedure Evaluation (Signed)
Anesthesia Evaluation  Patient identified by MRN, date of birth, ID band Patient awake    Reviewed: Allergy & Precautions, H&P , NPO status , Patient's Chart, lab work & pertinent test results, reviewed documented beta blocker date and time   Airway Mallampati: II  TM Distance: >3 FB Neck ROM: full    Dental no notable dental hx.    Pulmonary neg pulmonary ROS,    Pulmonary exam normal breath sounds clear to auscultation       Cardiovascular Exercise Tolerance: Good negative cardio ROS   Rhythm:regular Rate:Normal     Neuro/Psych negative neurological ROS  negative psych ROS   GI/Hepatic negative GI ROS, Neg liver ROS, GERD  Medicated,  Endo/Other  negative endocrine ROS  Renal/GU negative Renal ROS  negative genitourinary   Musculoskeletal   Abdominal   Peds  Hematology negative hematology ROS (+)   Anesthesia Other Findings   Reproductive/Obstetrics negative OB ROS                             Anesthesia Physical Anesthesia Plan  ASA: II  Anesthesia Plan: MAC   Post-op Pain Management:    Induction:   Airway Management Planned:   Additional Equipment:   Intra-op Plan:   Post-operative Plan:   Informed Consent: I have reviewed the patients History and Physical, chart, labs and discussed the procedure including the risks, benefits and alternatives for the proposed anesthesia with the patient or authorized representative who has indicated his/her understanding and acceptance.     Plan Discussed with: CRNA  Anesthesia Plan Comments:         Anesthesia Quick Evaluation

## 2015-08-30 NOTE — Interval H&P Note (Signed)
History and Physical Interval Note:  08/30/2015 9:05 AM  Corky Sing Epting  has presented today for surgery, with the diagnosis of H02831 OD H02.834 OS DERMATOCHALASIS H02.402 PTOSIS OS H02.401 PTOSIS OD H02.133 INVOLUTIONAL ECTROPION OF OD  The various methods of treatment have been discussed with the patient and family. After consideration of risks, benefits and other options for treatment, the patient has consented to  Procedure(s): BLEPHAROPLASTY (Bilateral) PTOSIS REPAIR (Bilateral) REPAIR OF ECTROPION (Right) as a surgical intervention .  The patient's history has been reviewed, patient examined, no change in status, stable for surgery.  I have reviewed the patient's chart and labs.  Questions were answered to the patient's satisfaction.     Vickki Muff, Temprence Rhines M

## 2015-08-30 NOTE — Op Note (Signed)
Preoperative Diagnosis:  1. Visually significant blepharoptosis both Upper Eyelid(s) 2. Visually significant dermatochalasis both Upper Eyelid(s) 3. Lower eyelid laxity with ectropion,  right  lower eyelid(s).  Postoperative Diagnosis:  Same.  Procedure(s) Performed:   1. Blepharoptosis repair with levator aponeurosis advancement both Upper Eyelid(s) 2. Upper eyelid blepharoplasty with excess skin excision  both Upper Eyelid(s) 3. Lateral tarsal strip procedure,  right  lower eyelid(s).  Teaching Surgeon: Philis Pique. Vickki Muff, M.D.  Assistants: none  Anesthesia: MAC  Specimens: None.  Estimated Blood Loss: Minimal.  Complications: None.  Operative Findings: None Dictated  Procedure:   Allergies were reviewed and the patient is allergic to septra..    After the risks, benefits, complications and alternatives were discussed with the patient, appropriate informed consent was obtained.  While seated in an upright position and looking in primary gaze, the mid pupillary line was marked on the upper eyelid margins bilaterally. The patient was then brought to the operating suite and reclined supine.  Timeout was conducted and the patient was sedated.  Local anesthetic consisting of a 50-50 mixture of 2% lidocaine with epinephrine and 0.75% bupivacaine with added Hylenex was injected subcutaneously to both upper eyelid(s). Additional anesthetic was  injected subcutaneously to the right lateral canthal region(s) and lower eyelid(s). Additional anesthetic was injected subconjunctivally to the right lower eyelid(s). Finally, anesthetic was injected down to the periosteum of the right lateral orbital rim(s). After adequate local was instilled, the patient was prepped and draped in the usual sterile fashion for eyelid surgery.   Attention was turned to the upper eyelids. A 75mm upper eyelid crease incision line was marked with calipers on both upper eyelid(s).  A pinch test was used to estimate the  amount of excess skin to remove and this was marked in standard blepharoplasty style fashion. Attention was turned to the  right upper eyelid. A #15 blade was used to open the premarked incision line. A skin only flap was excised and hemostasis was obtained with bipolar cautery.   Westcott scissors were then used to transect through orbicularis down to the tarsal plate. Epitarsus was dissected to create a smooth surface to suture to. Dissection was then carried superiorly in the plane between orbicularis and orbital septum. Once the preaponeurotic fat pocket was identified, the orbital septum was opened. This revealed the levator and its aponeurosis.    Attention was then turned to the opposite eyelid where the same procedure was performed in the same manner. Hemostasis was obtained with bipolar cautery throughout.   3 interrupted 6-0 Prolene sutures were then passed partial thickness through the tarsal plates of both upper eyelid(s). These sutures were placed in line with the mid pupillary, medial limbal, and lateral limbal lines. The sutures were fixed to the levator aponeurosis and adjusted until a nice lid height and contour were achieved. Once nice symmetry was achieved, the skin incisions were closed with a running 6-0 fast absorbing plain suture.    Attention was turned to the right lateral canthal angle. Westcott scissors were used to create a lateral canthotomy. Hemostasis was obtained with bipolar cautery. An inferior cantholysis was then performed with additional bipolar hemostasis. The anterior and posterior lamella of the lid were divided for approximately 8 mm.  A strip of the epithelium was excised off the superior margin of the tarsal strip and conjunctiva and retractors were incised off the inferior margin of the tarsal strip. A double-armed 4-0 Mersilene suture was then passed each arm through the terminal portion of  the tarsal strip. Each arm of the suture was then passed through the  periosteum of the inner portion of the lateral orbital rim at the level of Whitnall's tubercle. The sutures were advanced and this provided nice elevation and tightening of the lower eyelid. Once the suture was secured, a thin strip of follicle-bearing skin was excised. The lateral canthal angle was reformed with an interrupted 6-0 fast absorbing plain suture. Orbicularis was reapproximated with horizontal subcuticular 6-0 fast absorbing plain gut sutures. The skin was closed with interrupted 6-0 fast absorbing plain gut sutures.   Attention was then turned to the opposite eyelid where the same procedure was performed in the same manner.   The patient tolerated the procedure well.  Erythromycin ophthalmic ointment was applied to the incision site(s) followed by ice packs. The patient was taken to the recovery area where she recovered without difficulty.  Post-Op Plan/Instructions:  The patient was instructed to use ice packs frequently for the next 48 hours. She was instructed to use bacitracin ophthalmic ointment on Her incisions 4 times a day for the next 12 to 14 days. She was given a prescription for Percocet for pain control should Tylenol not be effective. She was asked to follow up at the [facility] in [place], [place]  in 2 weeks' time or sooner as needed for problems.  Teaching Surgeon Attestation: None  Kelly Keith M. Vickki Muff, M.D. Attending,Ophthalmology

## 2015-08-30 NOTE — H&P (Signed)
  See the H&P completed at [facility] on 08/12/2015 that is scanned into the chart

## 2015-08-30 NOTE — Transfer of Care (Signed)
Immediate Anesthesia Transfer of Care Note  Patient: Kelly Keith  Procedure(s) Performed: Procedure(s) with comments: BLEPHAROPLASTY (Bilateral) PTOSIS REPAIR (Bilateral) - eyelids REPAIR OF ECTROPION (Right)  Patient Location: PACU  Anesthesia Type: MAC  Level of Consciousness: awake, alert  and patient cooperative  Airway and Oxygen Therapy: Patient Spontanous Breathing and Patient connected to supplemental oxygen  Post-op Assessment: Post-op Vital signs reviewed, Patient's Cardiovascular Status Stable, Respiratory Function Stable, Patent Airway and No signs of Nausea or vomiting  Post-op Vital Signs: Reviewed and stable  Complications: No apparent anesthesia complications

## 2015-08-30 NOTE — Anesthesia Procedure Notes (Signed)
Procedure Name: MAC Performed by: Petina Muraski Pre-anesthesia Checklist: Patient identified, Emergency Drugs available, Suction available, Timeout performed and Patient being monitored Patient Re-evaluated:Patient Re-evaluated prior to inductionOxygen Delivery Method: Nasal cannula Placement Confirmation: positive ETCO2     

## 2015-08-30 NOTE — Anesthesia Postprocedure Evaluation (Signed)
Anesthesia Post Note  Patient: Kelly Keith  Procedure(s) Performed: Procedure(s) (LRB): BLEPHAROPLASTY (Bilateral) PTOSIS REPAIR (Bilateral) REPAIR OF ECTROPION (Right)  Patient location during evaluation: PACU Anesthesia Type: MAC Level of consciousness: awake and alert Pain management: pain level controlled Vital Signs Assessment: post-procedure vital signs reviewed and stable Respiratory status: spontaneous breathing, nonlabored ventilation and respiratory function stable Cardiovascular status: blood pressure returned to baseline and stable Postop Assessment: no signs of nausea or vomiting Anesthetic complications: no    DANIEL D KOVACS

## 2015-08-31 ENCOUNTER — Encounter: Payer: Self-pay | Admitting: Ophthalmology

## 2015-11-21 DIAGNOSIS — N952 Postmenopausal atrophic vaginitis: Secondary | ICD-10-CM | POA: Diagnosis not present

## 2015-11-21 DIAGNOSIS — R300 Dysuria: Secondary | ICD-10-CM | POA: Diagnosis not present

## 2015-11-21 DIAGNOSIS — N898 Other specified noninflammatory disorders of vagina: Secondary | ICD-10-CM | POA: Diagnosis not present

## 2015-11-23 DIAGNOSIS — L570 Actinic keratosis: Secondary | ICD-10-CM | POA: Diagnosis not present

## 2015-11-23 DIAGNOSIS — D1801 Hemangioma of skin and subcutaneous tissue: Secondary | ICD-10-CM | POA: Diagnosis not present

## 2015-11-23 DIAGNOSIS — D225 Melanocytic nevi of trunk: Secondary | ICD-10-CM | POA: Diagnosis not present

## 2015-11-23 DIAGNOSIS — L821 Other seborrheic keratosis: Secondary | ICD-10-CM | POA: Diagnosis not present

## 2015-11-23 DIAGNOSIS — X32XXXD Exposure to sunlight, subsequent encounter: Secondary | ICD-10-CM | POA: Diagnosis not present

## 2015-12-09 DIAGNOSIS — M810 Age-related osteoporosis without current pathological fracture: Secondary | ICD-10-CM | POA: Diagnosis not present

## 2015-12-09 DIAGNOSIS — M899 Disorder of bone, unspecified: Secondary | ICD-10-CM | POA: Diagnosis not present

## 2015-12-09 DIAGNOSIS — Z79899 Other long term (current) drug therapy: Secondary | ICD-10-CM | POA: Diagnosis not present

## 2015-12-09 DIAGNOSIS — E785 Hyperlipidemia, unspecified: Secondary | ICD-10-CM | POA: Diagnosis not present

## 2015-12-15 DIAGNOSIS — N390 Urinary tract infection, site not specified: Secondary | ICD-10-CM | POA: Diagnosis not present

## 2016-01-04 DIAGNOSIS — L820 Inflamed seborrheic keratosis: Secondary | ICD-10-CM | POA: Diagnosis not present

## 2016-01-30 DIAGNOSIS — L820 Inflamed seborrheic keratosis: Secondary | ICD-10-CM | POA: Diagnosis not present

## 2016-01-31 DIAGNOSIS — M19041 Primary osteoarthritis, right hand: Secondary | ICD-10-CM | POA: Diagnosis not present

## 2016-02-11 DIAGNOSIS — R399 Unspecified symptoms and signs involving the genitourinary system: Secondary | ICD-10-CM | POA: Diagnosis not present

## 2016-02-11 DIAGNOSIS — N390 Urinary tract infection, site not specified: Secondary | ICD-10-CM | POA: Diagnosis not present

## 2016-02-13 DIAGNOSIS — C44329 Squamous cell carcinoma of skin of other parts of face: Secondary | ICD-10-CM | POA: Diagnosis not present

## 2016-02-13 DIAGNOSIS — L568 Other specified acute skin changes due to ultraviolet radiation: Secondary | ICD-10-CM | POA: Diagnosis not present

## 2016-02-28 DIAGNOSIS — M19041 Primary osteoarthritis, right hand: Secondary | ICD-10-CM | POA: Diagnosis not present

## 2016-03-12 DIAGNOSIS — Z85828 Personal history of other malignant neoplasm of skin: Secondary | ICD-10-CM | POA: Diagnosis not present

## 2016-03-12 DIAGNOSIS — Z08 Encounter for follow-up examination after completed treatment for malignant neoplasm: Secondary | ICD-10-CM | POA: Diagnosis not present

## 2016-04-24 DIAGNOSIS — Z23 Encounter for immunization: Secondary | ICD-10-CM | POA: Diagnosis not present

## 2016-05-09 DIAGNOSIS — L820 Inflamed seborrheic keratosis: Secondary | ICD-10-CM | POA: Diagnosis not present

## 2016-05-09 DIAGNOSIS — L568 Other specified acute skin changes due to ultraviolet radiation: Secondary | ICD-10-CM | POA: Diagnosis not present

## 2016-06-18 ENCOUNTER — Encounter: Payer: Self-pay | Admitting: Obstetrics & Gynecology

## 2016-06-18 DIAGNOSIS — Z124 Encounter for screening for malignant neoplasm of cervix: Secondary | ICD-10-CM | POA: Diagnosis not present

## 2016-06-18 DIAGNOSIS — Z1231 Encounter for screening mammogram for malignant neoplasm of breast: Secondary | ICD-10-CM | POA: Diagnosis not present

## 2016-06-18 DIAGNOSIS — Z01419 Encounter for gynecological examination (general) (routine) without abnormal findings: Secondary | ICD-10-CM | POA: Diagnosis not present

## 2016-06-25 DIAGNOSIS — H43813 Vitreous degeneration, bilateral: Secondary | ICD-10-CM | POA: Diagnosis not present

## 2016-06-27 DIAGNOSIS — L820 Inflamed seborrheic keratosis: Secondary | ICD-10-CM | POA: Diagnosis not present

## 2016-07-05 DIAGNOSIS — M7711 Lateral epicondylitis, right elbow: Secondary | ICD-10-CM | POA: Diagnosis not present

## 2016-07-05 DIAGNOSIS — M25521 Pain in right elbow: Secondary | ICD-10-CM | POA: Diagnosis not present

## 2016-07-05 DIAGNOSIS — G8929 Other chronic pain: Secondary | ICD-10-CM | POA: Diagnosis not present

## 2016-07-30 DIAGNOSIS — L820 Inflamed seborrheic keratosis: Secondary | ICD-10-CM | POA: Diagnosis not present

## 2016-11-30 ENCOUNTER — Encounter: Payer: Self-pay | Admitting: Podiatry

## 2016-11-30 ENCOUNTER — Ambulatory Visit (INDEPENDENT_AMBULATORY_CARE_PROVIDER_SITE_OTHER): Payer: Medicare Other | Admitting: Podiatry

## 2016-11-30 VITALS — BP 124/69 | HR 82 | Resp 16

## 2016-11-30 DIAGNOSIS — L600 Ingrowing nail: Secondary | ICD-10-CM | POA: Diagnosis not present

## 2016-11-30 NOTE — Patient Instructions (Signed)

## 2016-12-02 NOTE — Progress Notes (Signed)
Subjective:    Patient ID: Kelly Keith, female   DOB: 71 y.o.   MRN: [id number]   HPI patient presents with a very painful right second nail that she cannot wear shoe gear with comfortably and she had the others removed and they've done great    ROS      Objective:  Physical Exam Neurovascular status intact with left nails healed well from previous surgery and thickened incurvated painful right hallux    Assessment:    Chronic nail disease right hallux with long-term pain and well-healed site left     Plan:   Conditions reviewed and recommended nail removal. Explained procedure and risk and today I infiltrated the right hallux 60 mg Xylocaine Marcaine mixture remove the hallux nail exposed matrix and applied phenol for applications 30 seconds followed by alcohol lavaged sterile dressing. Gave instructions on soaks and reappoint

## 2016-12-12 DIAGNOSIS — M810 Age-related osteoporosis without current pathological fracture: Secondary | ICD-10-CM | POA: Diagnosis not present

## 2016-12-12 DIAGNOSIS — M899 Disorder of bone, unspecified: Secondary | ICD-10-CM | POA: Diagnosis not present

## 2016-12-12 DIAGNOSIS — Z79899 Other long term (current) drug therapy: Secondary | ICD-10-CM | POA: Diagnosis not present

## 2016-12-12 DIAGNOSIS — E785 Hyperlipidemia, unspecified: Secondary | ICD-10-CM | POA: Diagnosis not present

## 2016-12-12 DIAGNOSIS — N183 Chronic kidney disease, stage 3 (moderate): Secondary | ICD-10-CM | POA: Diagnosis not present

## 2016-12-18 ENCOUNTER — Ambulatory Visit (INDEPENDENT_AMBULATORY_CARE_PROVIDER_SITE_OTHER): Payer: Self-pay | Admitting: Sports Medicine

## 2016-12-18 VITALS — HR 99

## 2016-12-18 DIAGNOSIS — Z9889 Other specified postprocedural states: Secondary | ICD-10-CM

## 2016-12-18 DIAGNOSIS — L600 Ingrowing nail: Secondary | ICD-10-CM

## 2016-12-18 MED ORDER — AMOXICILLIN-POT CLAVULANATE 875-125 MG PO TABS
1.0000 | ORAL_TABLET | Freq: Two times a day (BID) | ORAL | 0 refills | Status: DC
Start: 2016-12-18 — End: 2017-04-01

## 2016-12-21 NOTE — Progress Notes (Signed)
Pt presents s/p Rt hallux nail avulsion, DOS 5.11.18. She is concerned with redness that has recently appeared along the nail border. She denies pain at this time   Noted well healing nail bed with mild erythema to nail border, slight drainage to nail bed.   Advised her to continue soaking until there is no redness, pain or drainage. Consulted with Dr Cannon Kettle in regards to current condition and patient's past h/o osteomyelitis and amputations. Per Dr Cannon Kettle ordered Augmentin 875mg  called into patient's pharmacy. She is to report any acute symptom changes and report any worsening symptoms immediately.

## 2016-12-21 NOTE — Progress Notes (Signed)
Agree with below -Dr. Shaquan Puerta 

## 2016-12-31 DIAGNOSIS — M7711 Lateral epicondylitis, right elbow: Secondary | ICD-10-CM | POA: Diagnosis not present

## 2017-01-03 DIAGNOSIS — N390 Urinary tract infection, site not specified: Secondary | ICD-10-CM | POA: Diagnosis not present

## 2017-01-03 DIAGNOSIS — R300 Dysuria: Secondary | ICD-10-CM | POA: Diagnosis not present

## 2017-01-03 DIAGNOSIS — A499 Bacterial infection, unspecified: Secondary | ICD-10-CM | POA: Diagnosis not present

## 2017-02-08 DIAGNOSIS — R0609 Other forms of dyspnea: Secondary | ICD-10-CM | POA: Diagnosis not present

## 2017-02-08 DIAGNOSIS — R5383 Other fatigue: Secondary | ICD-10-CM | POA: Diagnosis not present

## 2017-02-08 DIAGNOSIS — E663 Overweight: Secondary | ICD-10-CM | POA: Diagnosis not present

## 2017-02-08 DIAGNOSIS — Z6825 Body mass index (BMI) 25.0-25.9, adult: Secondary | ICD-10-CM | POA: Diagnosis not present

## 2017-02-08 DIAGNOSIS — R42 Dizziness and giddiness: Secondary | ICD-10-CM | POA: Diagnosis not present

## 2017-02-14 DIAGNOSIS — R0600 Dyspnea, unspecified: Secondary | ICD-10-CM | POA: Diagnosis not present

## 2017-02-14 DIAGNOSIS — N183 Chronic kidney disease, stage 3 (moderate): Secondary | ICD-10-CM | POA: Diagnosis not present

## 2017-02-14 DIAGNOSIS — R42 Dizziness and giddiness: Secondary | ICD-10-CM | POA: Diagnosis not present

## 2017-02-14 DIAGNOSIS — R0609 Other forms of dyspnea: Secondary | ICD-10-CM | POA: Diagnosis not present

## 2017-02-14 DIAGNOSIS — R0602 Shortness of breath: Secondary | ICD-10-CM | POA: Diagnosis not present

## 2017-02-14 DIAGNOSIS — E785 Hyperlipidemia, unspecified: Secondary | ICD-10-CM | POA: Diagnosis not present

## 2017-02-15 DIAGNOSIS — R0609 Other forms of dyspnea: Secondary | ICD-10-CM | POA: Diagnosis not present

## 2017-02-21 DIAGNOSIS — R42 Dizziness and giddiness: Secondary | ICD-10-CM | POA: Diagnosis not present

## 2017-02-21 DIAGNOSIS — R0609 Other forms of dyspnea: Secondary | ICD-10-CM | POA: Diagnosis not present

## 2017-02-21 DIAGNOSIS — E785 Hyperlipidemia, unspecified: Secondary | ICD-10-CM | POA: Diagnosis not present

## 2017-02-21 DIAGNOSIS — R0602 Shortness of breath: Secondary | ICD-10-CM | POA: Diagnosis not present

## 2017-03-07 ENCOUNTER — Other Ambulatory Visit: Payer: Self-pay | Admitting: Cardiology

## 2017-03-07 DIAGNOSIS — R0609 Other forms of dyspnea: Secondary | ICD-10-CM | POA: Insufficient documentation

## 2017-03-07 DIAGNOSIS — R0602 Shortness of breath: Secondary | ICD-10-CM

## 2017-03-07 DIAGNOSIS — R079 Chest pain, unspecified: Secondary | ICD-10-CM | POA: Insufficient documentation

## 2017-03-07 DIAGNOSIS — R072 Precordial pain: Secondary | ICD-10-CM

## 2017-03-12 ENCOUNTER — Encounter: Payer: Self-pay | Admitting: Cardiology

## 2017-03-14 DIAGNOSIS — R0609 Other forms of dyspnea: Secondary | ICD-10-CM | POA: Diagnosis not present

## 2017-03-15 ENCOUNTER — Ambulatory Visit (HOSPITAL_COMMUNITY)
Admission: RE | Admit: 2017-03-15 | Discharge: 2017-03-15 | Disposition: A | Discharge status: Home / Self Care | Payer: Medicare Other | Source: Ambulatory Visit | Attending: Cardiology | Admitting: Cardiology

## 2017-03-15 ENCOUNTER — Encounter (HOSPITAL_COMMUNITY): Payer: Self-pay

## 2017-03-15 DIAGNOSIS — K449 Diaphragmatic hernia without obstruction or gangrene: Secondary | ICD-10-CM | POA: Insufficient documentation

## 2017-03-15 DIAGNOSIS — R0602 Shortness of breath: Secondary | ICD-10-CM

## 2017-03-15 DIAGNOSIS — J8410 Pulmonary fibrosis, unspecified: Secondary | ICD-10-CM | POA: Diagnosis not present

## 2017-03-15 DIAGNOSIS — R072 Precordial pain: Secondary | ICD-10-CM | POA: Diagnosis not present

## 2017-03-15 DIAGNOSIS — R0609 Other forms of dyspnea: Secondary | ICD-10-CM | POA: Diagnosis not present

## 2017-03-15 LAB — POCT I-STAT CREATININE: Creatinine, Ser: 1 mg/dL (ref 0.44–1.00)

## 2017-03-15 MED ORDER — IOPAMIDOL (ISOVUE-370) INJECTION 76%
INTRAVENOUS | Status: AC
  Administered 2017-03-15: 100 mL
  Filled 2017-03-15: qty 100

## 2017-03-15 MED ORDER — NITROGLYCERIN 0.4 MG SL SUBL
SUBLINGUAL_TABLET | SUBLINGUAL | Status: AC
  Filled 2017-03-15: qty 2

## 2017-03-15 MED ORDER — NITROGLYCERIN 0.4 MG SL SUBL
0.8000 mg | SUBLINGUAL_TABLET | Freq: Once | SUBLINGUAL | Status: AC
  Administered 2017-03-15: 0.8 mg via SUBLINGUAL
  Filled 2017-03-15: qty 25

## 2017-03-15 NOTE — Progress Notes (Signed)
Patient tolerated CT without incident. Drank water and crackers and ambulatory steady gait.

## 2017-03-18 DIAGNOSIS — N3090 Cystitis, unspecified without hematuria: Secondary | ICD-10-CM | POA: Diagnosis not present

## 2017-03-18 DIAGNOSIS — R300 Dysuria: Secondary | ICD-10-CM | POA: Diagnosis not present

## 2017-03-29 ENCOUNTER — Encounter: Payer: Self-pay | Admitting: Internal Medicine

## 2017-04-01 ENCOUNTER — Other Ambulatory Visit (INDEPENDENT_AMBULATORY_CARE_PROVIDER_SITE_OTHER): Payer: Medicare Other

## 2017-04-01 ENCOUNTER — Ambulatory Visit (INDEPENDENT_AMBULATORY_CARE_PROVIDER_SITE_OTHER): Payer: Medicare Other | Admitting: Internal Medicine

## 2017-04-01 VITALS — BP 140/80 | HR 94 | Ht 68.0 in | Wt 168.1 lb

## 2017-04-01 DIAGNOSIS — R0609 Other forms of dyspnea: Secondary | ICD-10-CM

## 2017-04-01 LAB — CBC WITH DIFFERENTIAL/PLATELET
BASOS ABS: 0.1 10*3/uL (ref 0.0–0.1)
Basophils Relative: 0.8 % (ref 0.0–3.0)
Eosinophils Absolute: 0.1 10*3/uL (ref 0.0–0.7)
Eosinophils Relative: 1.6 % (ref 0.0–5.0)
HEMATOCRIT: 40.2 % (ref 36.0–46.0)
HEMOGLOBIN: 13.2 g/dL (ref 12.0–15.0)
LYMPHS PCT: 30.3 % (ref 12.0–46.0)
Lymphs Abs: 2.2 10*3/uL (ref 0.7–4.0)
MCHC: 32.8 g/dL (ref 30.0–36.0)
MCV: 93.8 fl (ref 78.0–100.0)
MONOS PCT: 9 % (ref 3.0–12.0)
Monocytes Absolute: 0.7 10*3/uL (ref 0.1–1.0)
NEUTROS ABS: 4.2 10*3/uL (ref 1.4–7.7)
Neutrophils Relative %: 58.3 % (ref 43.0–77.0)
Platelets: 265 10*3/uL (ref 150.0–400.0)
RBC: 4.29 Mil/uL (ref 3.87–5.11)
RDW: 13.1 % (ref 11.5–15.5)
WBC: 7.3 10*3/uL (ref 4.0–10.5)

## 2017-04-01 LAB — BRAIN NATRIURETIC PEPTIDE: PRO B NATRI PEPTIDE: 28 pg/mL (ref 0.0–100.0)

## 2017-04-01 MED ORDER — PANTOPRAZOLE SODIUM 40 MG PO TBEC: 40.0000 mg | DELAYED_RELEASE_TABLET | Freq: Every day | ORAL | 2 refills | Status: DC

## 2017-04-01 MED ORDER — FAMOTIDINE 20 MG PO TABS: ORAL_TABLET | ORAL | 2 refills | Status: DC

## 2017-04-01 NOTE — Patient Instructions (Addendum)
Pantoprazole (protonix) 40 mg   Take  30-60 min before first meal of the day and Pepcid (famotidine)  20 mg one @  bedtime until return to office - this is the best way to tell whether stomach acid is contributing to your problem.    GERD (REFLUX)  is an extremely common cause of respiratory symptoms just like yours , many times with no obvious heartburn at all.    It can be treated with medication, but also with lifestyle changes including elevation of the head of your bed (ideally with 6 inch  bed blocks),  Smoking cessation, avoidance of late meals, excessive alcohol, and avoid fatty foods, chocolate, peppermint, colas, red wine, and acidic juices such as orange juice.  NO MINT OR MENTHOL PRODUCTS SO NO COUGH DROPS   USE SUGARLESS CANDY INSTEAD (Jolley ranchers or Stover's or Life Savers) or even ice chips will also do - the key is to swallow to prevent all throat clearing. NO OIL BASED VITAMINS - use powdered substitutes.    Please remember to go to the lab department downstairs in the basement  for your tests - we will call you with the results when they are available.      Please schedule a follow up office visit in 4 weeks, sooner if needed  Add:  Need Tilley's last note/ echo report

## 2017-04-01 NOTE — Progress Notes (Signed)
Subjective:     Patient ID: Kelly Keith, female   DOB: [birth year],    MRN: [id number]  HPI  62 never smoker exp to second hand smoke as child/teenager but no adverse sequelae/ avg ex tolerance moved to [place] in 1980 and since around 2000 spring > fall rhinitis rx otc H1 blockers/ flonase  and new progressive doe x summer 2017 with neg cardiac and poor performance on GXT/ treadmill per Dr  Wynonia Lawman so referred to pulmonary clinic 04/01/2017 by Dr   Hulan Fess with abn spirometry which was repeated 04/01/2017  = wnl x for suggestion of small airways dz     04/01/2017 1st [facility] office visit/ Trip Cavanagh   Chief Complaint  Patient presents with  . pulmonary consult    Pt was referred by Dr. Rex Kras for mild COPD. Pt states she has severe SOB  indolent onset, slowly progressive = doe= MMRC1 = can walk nl pace, flat grade, can't hurry or go uphills or steps s sob   Never sob/ never noct / always proportionate to activity intensity   Also light head with exertion / hoarse and tickle in throat = dry cough    No obvious day to day or daytime variability or assoc excess/ purulent sputum or mucus plugs or hemoptysis or cp or chest tightness, subjective wheeze or overt sinus or hb symptoms. No unusual exp hx or h/o childhood pna/ asthma or knowledge of premature birth.  Sleeping ok without nocturnal  or early am exacerbation  of respiratory  c/o's or need for noct saba. Also denies any obvious fluctuation of symptoms with weather or environmental changes or other aggravating or alleviating factors except as outlined above   Current Medications, Allergies, Complete Past Medical History, Past Surgical History, Family History, and Social History were reviewed in [employer] record.  ROS  The following are not active complaints unless bolded sore throat, dysphagia, dental problems, itching, sneezing,  nasal congestion or excess/ purulent secretions, ear ache,   fever, chills,  sweats, unintended wt loss, classically pleuritic or exertional cp,  orthopnea pnd or leg swelling, presyncope, palpitations, abdominal pain, anorexia, nausea, vomiting, diarrhea  or change in bowel or bladder habits, change in stools or urine, dysuria,hematuria,  rash, arthralgias, visual complaints, headache, numbness, weakness or ataxia or problems with walking or coordination,  change in mood/affect or memory.       Current Meds  Medication Sig  . Coenzyme Q10 (COQ10 PO) Take by mouth daily.  Marland Kitchen conjugated estrogens (PREMARIN) vaginal cream APPLY ONE APPLICATOR VAGINALLY TWICE WEEKLY  . diclofenac sodium (VOLTAREN) 1 % GEL Apply topically.  . fexofenadine (ALLEGRA) 180 MG tablet Take by mouth.  Marland Kitchen FLAXSEED, LINSEED, PO Take by mouth daily.  Marland Kitchen GARLIC PO Take by mouth daily.  . Ginger, Zingiber officinalis, (GINGER PO) Take by mouth daily.  . Influenza Vac Split High-Dose (FLUZONE HIGH-DOSE) 0.5 ML SUSY TO BE ADMINISTERED BY PHARMACIST FOR IMMUNIZATION  . ketoconazole (NIZORAL) 2 % cream   . Lactobacillus Rhamnosus, GG, (CULTURELLE PO) Take by mouth.  . Lutein 6 MG CAPS Take by mouth.  . Milk Thistle Extract 87.5 MG CAPS Take by mouth.  Marland Kitchen MILK THISTLE PO Take by mouth daily.  . Multiple Vitamin (MULTIVITAMIN) tablet Take 1 tablet by mouth daily.  . Nutritional Supplements (CARDIO COMPLETE PO) Take by mouth daily.  Marland Kitchen OVER THE COUNTER MEDICATION daily. Joint advantage  . OVER THE COUNTER MEDICATION 2 (two) times daily. True osteo (bone health)  .  Papaya CHEW Chew by mouth 2 (two) times daily.  . Probiotic Product (PROBIOTIC DAILY PO) Take by mouth daily.  . Turmeric POWD Take by mouth.  Blanch Media CREA   . [DISCONTINUED] diclofenac sodium (VOLTAREN) 1 % GEL Apply topically 2 (two) times daily as needed.  . [DISCONTINUED] fexofenadine (ALLEGRA) 180 MG tablet Take 180 mg by mouth daily.  . [DISCONTINUED] Flaxseed, Linseed, (FLAXSEED OIL MAX STR) 1300 MG CAPS Take by mouth.  . [DISCONTINUED]  Garlic Oil 2 MG CAPS Take by mouth.  . [DISCONTINUED] Ginger, Zingiber officinalis, 500 MG CAPS Take by mouth.  . [DISCONTINUED] LUTEIN PO Take by mouth daily.  . [DISCONTINUED] Multiple Vitamin (MULTI-VITAMINS) TABS Take by mouth.  . [DISCONTINUED] PAPAYA PO Take by mouth.  . [DISCONTINUED] TURMERIC PO Take by mouth 2 (two) times daily.       Review of Systems     Objective:   Physical Exam    amb hoarse wf nad   Wt Readings from Last 3 Encounters:  04/01/17 168 lb 2 oz (76.3 kg)  08/30/15 159 lb (72.1 kg)  12/15/14 160 lb (72.6 kg)    Vital signs reviewed  - Note on arrival 02 sats  98% on RA      HEENT: nl dentition, turbinates bilaterally, and oropharynx. Nl external ear canal on R/ L ear mod blocked with hard wax   without cough reflex   NECK :  without JVD/Nodes/TM/ nl carotid upstrokes bilaterally   LUNGS: no acc muscle use,  Nl contour chest which is clear to A and P bilaterally without cough on insp or exp maneuvers   CV:  RRR  no s3 or murmur or increase in P2, and no edema   ABD:  soft and nontender with nl inspiratory excursion in the supine position. No bruits or organomegaly appreciated, bowel sounds nl  MS:  Nl gait/ ext warm without deformities, calf tenderness, cyanosis or clubbing No obvious joint restrictions   SKIN: warm and dry without lesions    NEURO:  alert, approp, nl sensorium with  no motor or cerebellar deficits apparent.      I personally reviewed images and agree with radiology impression as follows:   Chest CTa  03/15/17 No acute or significant extracardiac abnormality.   Labs ordered/ reviewed:      Chemistry      Component Value Date/Time   CREATININE 1.00 03/15/2017 0933         Lab Results  Component Value Date   WBC 7.3 04/01/2017   HGB 13.2 04/01/2017   HCT 40.2 04/01/2017   MCV 93.8 04/01/2017   PLT 265.0 04/01/2017       EOS                       0.1                                                                    04/01/2017     Lab Results  Component Value Date   PROBNP 28.0 04/01/2017      Labs ordered 04/01/2017  Allergy profile       Assessment:

## 2017-04-02 ENCOUNTER — Encounter: Payer: Self-pay | Admitting: Internal Medicine

## 2017-04-02 LAB — RESPIRATORY ALLERGY PROFILE REGION II ~~LOC~~
Allergen, A. alternata, m6: <0.1 kU/L
Allergen, Comm Silver Birch, t9: <0.1 kU/L
Allergen, D pternoyssinus,d7: <0.1 kU/L
Allergen, Mouse Urine Protein, e78: <0.1 kU/L
Allergen, Mulberry, t76: <0.1 kU/L
Allergen, Oak,t7: <0.1 kU/L
Allergen, P. notatum, m1: <0.1 kU/L
Box Elder IgE: <0.1 kU/L
CLASS: 0
CLASS: 0
CLASS: 0
CLASS: 0
CLASS: 0
CLASS: 0
CLASS: 0
CLASS: 0
CLASS: 0
CLASS: 0
Class: 0
Class: 0
Class: 0
Class: 0
Class: 0
Class: 0
Class: 0
Class: 0
Class: 0
Class: 0
Class: 0
Class: 0
Class: 0
Class: 0
Cockroach: <0.1 kU/L
D. farinae: <0.1 kU/L
Elm IgE: <0.1 kU/L
IgE (Immunoglobulin E), Serum: 11 kU/L (ref <=114)
Johnson Grass: <0.1 kU/L
Timothy Grass: <0.1 kU/L

## 2017-04-02 LAB — INTERPRETATION:

## 2017-04-02 NOTE — Assessment & Plan Note (Signed)
Echo 2016 Mild / moderate TR with ef 65%  - 04/01/2017  Walked RA x 3 laps @ 185 ft each stopped due to  End of study, fast pace, no sob - sats 100% at end    - Spirometry 04/01/2017  FEV1 2.23 (83%)  Ratio 75 with min curvature and no rx prior - Allergy profile 04/01/2017 >  Eos 0.1/  IgE      Symptoms are markedly disproportionate to objective findings and not clear this is actually much of a  lung problem but pt does appear to have difficult to sort out respiratory symptoms of unknown origin for which  DDX  = almost all start with A and  include Adherence, Ace Inhibitors, Acid Reflux, Active Sinus Disease, Alpha 1 Antitripsin deficiency, Anxiety masquerading as Airways dz,  ABPA,  Allergy(esp in young), Aspiration (esp in elderly), Adverse effects of meds,  Active smokers, A bunch of PE's (a small clot burden can't cause this syndrome unless there is already severe underlying pulm or vascular dz with poor reserve) plus two Bs  = Bronchiectasis and Beta blocker use..and one C= CHF     Adherence is always the initial "prime suspect" and is a multilayered concern that requires a "trust but verify" approach in every patient - starting with knowing how to use medications, especially inhalers, correctly, keeping up with refills and understanding the fundamental difference between maintenance and prns vs those medications only taken for a very short course and then stopped and not refilled.   ? Acid (or non-acid) GERD > always difficult to exclude as up to 75% of pts in some series report no assoc GI/ Heartburn symptoms> rec max (24h)  acid suppression and diet restrictions/ reviewed and instructions given in writing.   ? Allergy > does have a throat tickle but this could be gerd as no seasonal pattern/ assoc sneezing or itching > panel sent  ? Anxiety > usually at the bottom of this list of usual suspects but should be much higher on this pt's based on H and P and note already on psychotropics  ? Anxiety  > usually at the bottom of this list of usual suspects but suspect she has an element of deconditioning because limiting activity now   ? A bunch of PE's > very unlikely given study today and recent neg CTa chest done for coronaries   ? CHF > excluded by cards w/u and BNP so low   Will start with max rx for gerd and return in 4 weeks, consider cpst next   Total time devoted to counseling  > 50 % of initial 60 min office visit:  review case with pt/ discussion of options/alternatives/ personally creating written customized instructions  in presence of pt  then going over those specific  Instructions directly with the pt including how to use all of the meds but in particular covering each new medication in detail and the difference between the maintenance= "automatic" meds and the prns using an action plan format for the latter (If this problem/symptom => do that organization reading Left to right).  Please see AVS from this visit for a full list of these instructions which I personally wrote for this pt and  are unique to this visit.

## 2017-04-03 NOTE — Progress Notes (Signed)
Spoke with pt and notified of results per Dr. Wert. Pt verbalized understanding and denied any questions. 

## 2017-04-10 ENCOUNTER — Other Ambulatory Visit (HOSPITAL_COMMUNITY): Payer: Medicare Other

## 2017-04-29 ENCOUNTER — Other Ambulatory Visit: Payer: Self-pay | Admitting: *Deleted

## 2017-04-29 ENCOUNTER — Other Ambulatory Visit: Payer: Self-pay

## 2017-04-29 MED ORDER — NONFORMULARY OR COMPOUNDED ITEM: 1 refills | Status: DC

## 2017-04-29 MED ORDER — ESTROGENS, CONJUGATED 0.625 MG/GM VA CREA: TOPICAL_CREAM | VAGINAL | 0 refills | Status: DC

## 2017-04-29 NOTE — Telephone Encounter (Signed)
Pt called stating wrong Rx was sent to pharmacy, pt takes estradiol 0.02% compound from [facility]. Per paper chart annual due in Nov 2018, confirmed Rx from paper chart as well. Pt aware.

## 2017-04-30 ENCOUNTER — Encounter: Payer: Self-pay | Admitting: Internal Medicine

## 2017-04-30 ENCOUNTER — Ambulatory Visit (INDEPENDENT_AMBULATORY_CARE_PROVIDER_SITE_OTHER): Payer: Medicare Other | Admitting: Internal Medicine

## 2017-04-30 VITALS — BP 98/60 | HR 97 | Ht 68.0 in | Wt 164.0 lb

## 2017-04-30 DIAGNOSIS — R0609 Other forms of dyspnea: Secondary | ICD-10-CM

## 2017-04-30 NOTE — Patient Instructions (Addendum)
Stop reflux meds and return to Dr Wynonia Lawman as planned to consider further cardiac evaluation and if needed a CPST (cardiopulmonary stress tests)   Exercise should be where are a little short of breath but never out of breath x 30 min daily   Also consider ENT eval of your throat at some point if needed   Pulmonary follow up is as needed

## 2017-04-30 NOTE — Progress Notes (Signed)
Subjective:    Patient ID: Kelly Keith, female   DOB: 25-Jul-1945,    MRN: [id number]    Brief patient profile:  54 never smoker exp to second hand smoke as child/teenager but no adverse sequelae/ avg ex tolerance moved to [place] in 1980 and since around 2000 spring > fall rhinitis rx otc H1 blockers/ flonase  and new progressive doe x summer 2017 with neg cardiac and poor performance on GXT/ treadmill per Dr  Wynonia Lawman so referred to pulmonary clinic 04/01/2017 by Dr   Hulan Fess with abn spirometry which was repeated 04/01/2017  = wnl x for suggestion of small airways dz    History of Present Illness  04/01/2017 1st [facility] office visit/ Kelly Keith   Chief Complaint  Patient presents with  . pulmonary consult    Pt was referred by Dr. Rex Kras for mild COPD. Pt states she has severe SOB  indolent onset, slowly progressive = doe= MMRC1 = can walk nl pace, flat grade, can't hurry or go uphills or steps s sob   Never sob/ never noct / always proportionate to activity intensity  Also light head with exertion but also when sitting , never supine  / hoarse and tickle in throat = dry cough  rec Pantoprazole (protonix) 40 mg   Take  30-60 min before first meal of the day and Pepcid (famotidine)  20 mg one @  bedtime until return to office - this is the best way to tell whether stomach acid is contributing to your problem.   GERD   Diet  Please remember to go to the lab department downstairs in the basement  for your tests - we will call you with the results when they are available.    04/30/2017  f/u ov/Kelly Keith re: doe/ light headedness  Chief Complaint  Patient presents with  . Follow-up    SOB, faint feeling, does not think medication is working  in retrospect, the voice issue goes back entire life / no change on diet /meds for gerd Walking a mile a day x 18 min s limiting sob but does sometimes get to feeling light headed/ fatigued   No obvious day to day or daytime variability or assoc excess/  purulent sputum or mucus plugs or hemoptysis or cp or chest tightness, subjective wheeze or overt sinus or hb symptoms. No unusual exp hx or h/o childhood pna/ asthma or knowledge of premature birth.  Sleeping ok flat without nocturnal  or early am exacerbation  of respiratory  c/o's or need for noct saba. Also denies any obvious fluctuation of symptoms with weather or environmental changes or other aggravating or alleviating factors except as outlined above   Current Allergies, Complete Past Medical History, Past Surgical History, Family History, and Social History were reviewed in [employer] record.  ROS  The following are not active complaints unless bolded Hoarseness, sore throat, dysphagia, dental problems, itching, sneezing,  nasal congestion or discharge of excess mucus or purulent secretions, ear ache,   fever, chills, sweats, unintended wt loss or wt gain, classically pleuritic or exertional cp,  orthopnea pnd or leg swelling, presyncope, palpitations, abdominal pain, anorexia, nausea, vomiting, diarrhea  or change in bowel habits or change in bladder habits, change in stools or change in urine, dysuria, hematuria,  rash, arthralgias, visual complaints, headache, numbness, weakness or ataxia or problems with walking or coordination,  change in mood/affect or memory.        Current Meds  Medication Sig  .  Coenzyme Q10 (COQ10 PO) Take by mouth daily.  . famotidine (PEPCID) 20 MG tablet One at bedtime  . fexofenadine (ALLEGRA) 180 MG tablet Take by mouth.  Marland Kitchen FLAXSEED, LINSEED, PO Take by mouth daily.  Marland Kitchen GARLIC PO Take by mouth daily.  . Ginger, Zingiber officinalis, (GINGER PO) Take by mouth daily.  . Influenza Vac Split High-Dose (FLUZONE HIGH-DOSE) 0.5 ML SUSY TO BE ADMINISTERED BY PHARMACIST FOR IMMUNIZATION  . ketoconazole (NIZORAL) 2 % cream   . Lactobacillus Rhamnosus, GG, (CULTURELLE PO) Take by mouth.  . Lutein 6 MG CAPS Take by mouth.  . Milk Thistle  Extract 87.5 MG CAPS Take by mouth.  Marland Kitchen MILK THISTLE PO Take by mouth daily.  . Multiple Vitamin (MULTIVITAMIN) tablet Take 1 tablet by mouth daily.  . NONFORMULARY OR COMPOUNDED ITEM Estradiol vaginal cream 0.02% insert 1 gram twice weekly vaginally  . Nutritional Supplements (CARDIO COMPLETE PO) Take by mouth daily.  Marland Kitchen OVER THE COUNTER MEDICATION daily. Joint advantage  . OVER THE COUNTER MEDICATION 2 (two) times daily. True osteo (bone health)  . pantoprazole (PROTONIX) 40 MG tablet Take 1 tablet (40 mg total) by mouth daily. Take 30-60 min before first meal of the day  . Papaya CHEW Chew by mouth 2 (two) times daily.  . Probiotic Product (PROBIOTIC DAILY PO) Take by mouth daily.  . Turmeric POWD Take by mouth.  Blanch Media CREA                             Objective:   Physical Exam    amb hoarse wf nad   Wt Readings from Last 3 Encounters:  04/01/17 168 lb 2 oz (76.3 kg)  08/30/15 159 lb (72.1 kg)  12/15/14 160 lb (72.6 kg)    Vital signs reviewed  - Note on arrival 02 sats  98% on RA      HEENT: nl dentition, turbinates bilaterally, and oropharynx. Nl external ear canal on R/ L ear mod blocked with hard wax   without cough reflex   NECK :  without JVD/Nodes/TM/ nl carotid upstrokes bilaterally   LUNGS: no acc muscle use,  Nl contour chest which is clear to A and P bilaterally without cough on insp or exp maneuvers   CV:  RRR  no s3 or murmur or increase in P2, and no edema   ABD:  soft and nontender with nl inspiratory excursion in the supine position. No bruits or organomegaly appreciated, bowel sounds nl  MS:  Nl gait/ ext warm without deformities, calf tenderness, cyanosis or clubbing No obvious joint restrictions   SKIN: warm and dry without lesions    NEURO:  alert, approp, nl sensorium with  no motor or cerebellar deficits apparent.      I personally reviewed images and agree with radiology impression as follows:  Chest CTa  03/15/17 No acute or  significant extracardiac abnormality.   Labs   reviewed:      Chemistry      Component Value Date/Time   CREATININE 1.00 03/15/2017 0933         Lab Results  Component Value Date   WBC 7.3 04/01/2017   HGB 13.2 04/01/2017   HCT 40.2 04/01/2017   MCV 93.8 04/01/2017   PLT 265.0 04/01/2017       EOS                       0.1  04/01/2017     Lab Results  Component Value Date   PROBNP 28.0 04/01/2017             Assessment:

## 2017-05-01 DIAGNOSIS — X32XXXD Exposure to sunlight, subsequent encounter: Secondary | ICD-10-CM | POA: Diagnosis not present

## 2017-05-01 DIAGNOSIS — L820 Inflamed seborrheic keratosis: Secondary | ICD-10-CM | POA: Diagnosis not present

## 2017-05-01 DIAGNOSIS — L570 Actinic keratosis: Secondary | ICD-10-CM | POA: Diagnosis not present

## 2017-05-01 NOTE — Assessment & Plan Note (Addendum)
Echo 2016 Mild / moderate TR with ef 65%  - 04/01/2017  Walked RA x 3 laps @ 185 ft each stopped due to  End of study, fast pace, no sob - sats 100% at end    - Spirometry 04/01/2017  FEV1 2.23 (83%)  Ratio 75 with min curvature and no rx prior - Allergy profile 04/01/2017 >  Eos 0.1/  IgE   11  RAST neg  - Echo 02/19/17  Mild to mod MR s LAE with nl ef, mild LVH   I had an extended final summary discussion with the patient reviewing all relevant studies completed to date and  lasting 15 to 20 minutes of a 25 minute visit on the following issues:    No pulmonary problem identified/ on reflection voice fatigue x decades, no better on gerd rx nor was her ex symptoms so ok to stop the gerd rx/ ent f/u prn   Next step I would consider is  CPST but defer this to Dr Wynonia Lawman to decide   Each maintenance medication was reviewed in detail including most importantly the difference between maintenance and as needed and under what circumstances the prns are to be used.  Please see AVS for specific  Instructions which are unique to this visit and I personally typed out  which were reviewed in detail in writing with the patient and a copy provided.     Pulmonary f/u is prn

## 2017-05-03 DIAGNOSIS — M7711 Lateral epicondylitis, right elbow: Secondary | ICD-10-CM | POA: Diagnosis not present

## 2017-05-04 DIAGNOSIS — Z23 Encounter for immunization: Secondary | ICD-10-CM | POA: Diagnosis not present

## 2017-05-10 DIAGNOSIS — R42 Dizziness and giddiness: Secondary | ICD-10-CM | POA: Diagnosis not present

## 2017-05-10 DIAGNOSIS — R0602 Shortness of breath: Secondary | ICD-10-CM | POA: Diagnosis not present

## 2017-05-10 DIAGNOSIS — R0609 Other forms of dyspnea: Secondary | ICD-10-CM | POA: Diagnosis not present

## 2017-05-10 DIAGNOSIS — E785 Hyperlipidemia, unspecified: Secondary | ICD-10-CM | POA: Diagnosis not present

## 2017-06-24 DIAGNOSIS — H02052 Trichiasis without entropian right lower eyelid: Secondary | ICD-10-CM | POA: Diagnosis not present

## 2017-06-24 DIAGNOSIS — H2511 Age-related nuclear cataract, right eye: Secondary | ICD-10-CM | POA: Diagnosis not present

## 2017-06-25 DIAGNOSIS — Z1231 Encounter for screening mammogram for malignant neoplasm of breast: Secondary | ICD-10-CM | POA: Diagnosis not present

## 2017-07-03 ENCOUNTER — Encounter: Payer: Medicare Other | Admitting: Obstetrics & Gynecology

## 2017-08-14 ENCOUNTER — Ambulatory Visit (INDEPENDENT_AMBULATORY_CARE_PROVIDER_SITE_OTHER): Payer: Medicare Other | Admitting: Obstetrics & Gynecology

## 2017-08-14 ENCOUNTER — Encounter: Payer: Self-pay | Admitting: Obstetrics & Gynecology

## 2017-08-14 VITALS — Ht 68.0 in | Wt 163.0 lb

## 2017-08-14 DIAGNOSIS — Z01411 Encounter for gynecological examination (general) (routine) with abnormal findings: Secondary | ICD-10-CM

## 2017-08-14 DIAGNOSIS — Z780 Asymptomatic menopausal state: Secondary | ICD-10-CM

## 2017-08-14 DIAGNOSIS — Z90710 Acquired absence of both cervix and uterus: Secondary | ICD-10-CM

## 2017-08-14 NOTE — Progress Notes (Signed)
Sussie Minor Stenner 12-28-1945 [id number]   History:    72 y.o. G1P1L1  Married.  Daughter is 91 yo, doing well.  RP:  Established patient presenting for annual gyn exam   HPI: Status post hysterectomy.  No pelvic pain.  Menopause, well on no hormone replacement therapy.  Not currently sexually active.  Breasts normal.  No significant stress urinary incontinence, except for mild leakage from incompletely emptied the bladder.  Bowel movements normal.  Enjoys walking, occasional water aerobics.  Body mass index 24.78.  Health labs with family physician.  Past medical history,surgical history, family history and social history were all reviewed and documented in the EPIC chart.  Gynecologic History No LMP recorded. Patient has had a hysterectomy. Contraception: status post hysterectomy Last Pap: 05/2016. Results were: Negative Last mammogram: Fall 2018. Results were: normal Bone density 2015 [place] 2010  Obstetric History OB History  Gravida Para Term Preterm AB Living  1 1       1   SAB TAB Ectopic Multiple Live Births               # Outcome Date GA Lbr Len/2nd Weight Sex Delivery Anes PTL Lv  1 Para                ROS: A ROS was performed and pertinent positives and negatives are included in the history.  GENERAL: No fevers or chills. HEENT: No change in vision, no earache, sore throat or sinus congestion. NECK: No pain or stiffness. CARDIOVASCULAR: No chest pain or pressure. No palpitations. PULMONARY: No shortness of breath, cough or wheeze. GASTROINTESTINAL: No abdominal pain, nausea, vomiting or diarrhea, melena or bright red blood per rectum. GENITOURINARY: No urinary frequency, urgency, hesitancy or dysuria. MUSCULOSKELETAL: No joint or muscle pain, no back pain, no recent trauma. DERMATOLOGIC: No rash, no itching, no lesions. ENDOCRINE: No polyuria, polydipsia, no heat or cold intolerance. No recent change in weight. HEMATOLOGICAL: No anemia or easy bruising or bleeding.  NEUROLOGIC: No headache, seizures, numbness, tingling or weakness. PSYCHIATRIC: No depression, no loss of interest in normal activity or change in sleep pattern.     Exam:   Ht 5\' 8"  (1.727 m)   Wt 163 lb (73.9 kg)   BMI 24.78 kg/m   Body mass index is 24.78 kg/m.  General appearance : Well developed well nourished female. No acute distress HEENT: Eyes: no retinal hemorrhage or exudates,  Neck supple, trachea midline, no carotid bruits, no thyroidmegaly Lungs: Clear to auscultation, no rhonchi or wheezes, or rib retractions  Heart: Regular rate and rhythm, no murmurs or gallops Breast:Examined in sitting and supine position were symmetrical in appearance, no palpable masses or tenderness,  no skin retraction, no nipple inversion, no nipple discharge, no skin discoloration, no axillary or supraclavicular lymphadenopathy Abdomen: no palpable masses or tenderness, no rebound or guarding Extremities: no edema or skin discoloration or tenderness  Pelvic: Vulva normal  Bartholin, Urethra, Skene Glands: Within normal limits             Vagina: No gross lesions or discharge  Cervix/Uterus Absent  Adnexa  Without masses or tenderness  Anus and perineum  normal    Assessment/Plan:  72 y.o. female for annual exam   1. Encounter for gynecological examination with abnormal finding Gynecologic exam status post hysterectomy.  Pap tests negative November 2017.  Will repeat at 2-3 years.  Breast exam normal.  Mammogram normal fall 2018.  Colonoscopy 2010.  Health labs with family physician.  Continue regular physical activity.  2. Menopause present Well on no hormone replacement therapy.  Currently abstinent.  Decision to stop vaginal estradiol cream.  Continue with vitamin D supplements, calcium rich nutrition and weightbearing physical activity on a regular basis.  Recommend screening bone density now.  Will organize with family physician.  Patient is reluctant to treat if osteoporosis is  present.  Information and pamphlet given on Prolia, for her to be better informed on the possible treatments available now.  3. S/P total hysterectomy  Counseling on above issues more than 50% for 10 minutes.  [name] MD, 11:18 AM 08/14/2017

## 2017-08-14 NOTE — Patient Instructions (Signed)
1. Encounter for gynecological examination with abnormal finding Gynecologic exam status post hysterectomy.  Pap tests negative November 2017.  Will repeat at 2-3 years.  Breast exam normal.  Mammogram normal fall 2018.  Colonoscopy 2010.  Health labs with family physician.  Continue regular physical activity.  2. Menopause present Well on no hormone replacement therapy.  Currently abstinent.  Decision to stop vaginal estradiol cream.  Continue with vitamin D supplements, calcium rich nutrition and weightbearing physical activity on a regular basis.  Recommend screening bone density now.  Will organize with family physician.  Patient is reluctant to treat if osteoporosis is present.  Information and pamphlet given on Prolia, for her to be better informed on the possible treatments available now.  3. S/P total hysterectomy  Kelly Keith, it was a pleasure seeing you today!   Health Maintenance for Postmenopausal Women Menopause is a normal process in which your reproductive ability comes to an end. This process happens gradually over a span of months to years, usually between the ages of 85 and 74. Menopause is complete when you have missed 12 consecutive menstrual periods. It is important to talk with your health care provider about some of the most common conditions that affect postmenopausal women, such as heart disease, cancer, and bone loss (osteoporosis). Adopting a healthy lifestyle and getting preventive care can help to promote your health and wellness. Those actions can also lower your chances of developing some of these common conditions. What should I know about menopause? During menopause, you may experience a number of symptoms, such as:  Moderate-to-severe hot flashes.  Night sweats.  Decrease in sex drive.  Mood swings.  Headaches.  Tiredness.  Irritability.  Memory problems.  Insomnia.  Choosing to treat or not to treat menopausal changes is an individual decision that  you make with your health care provider. What should I know about hormone replacement therapy and supplements? Hormone therapy products are effective for treating symptoms that are associated with menopause, such as hot flashes and night sweats. Hormone replacement carries certain risks, especially as you become older. If you are thinking about using estrogen or estrogen with progestin treatments, discuss the benefits and risks with your health care provider. What should I know about heart disease and stroke? Heart disease, heart attack, and stroke become more likely as you age. This may be due, in part, to the hormonal changes that your body experiences during menopause. These can affect how your body processes dietary fats, triglycerides, and cholesterol. Heart attack and stroke are both medical emergencies. There are many things that you can do to help prevent heart disease and stroke:  Have your blood pressure checked at least every 1-2 years. High blood pressure causes heart disease and increases the risk of stroke.  If you are 37-59 years old, ask your health care provider if you should take aspirin to prevent a heart attack or a stroke.  Do not use any tobacco products, including cigarettes, chewing tobacco, or electronic cigarettes. If you need help quitting, ask your health care provider.  It is important to eat a healthy diet and maintain a healthy weight. ? Be sure to include plenty of vegetables, fruits, low-fat dairy products, and lean protein. ? Avoid eating foods that are high in solid fats, added sugars, or salt (sodium).  Get regular exercise. This is one of the most important things that you can do for your health. ? Try to exercise for at least 150 minutes each week. The type of  exercise that you do should increase your heart rate and make you sweat. This is known as moderate-intensity exercise. ? Try to do strengthening exercises at least twice each week. Do these in addition  to the moderate-intensity exercise.  Know your numbers.Ask your health care provider to check your cholesterol and your blood glucose. Continue to have your blood tested as directed by your health care provider.  What should I know about cancer screening? There are several types of cancer. Take the following steps to reduce your risk and to catch any cancer development as early as possible. Breast Cancer  Practice breast self-awareness. ? This means understanding how your breasts normally appear and feel. ? It also means doing regular breast self-exams. Let your health care provider know about any changes, no matter how small.  If you are 27 or older, have a clinician do a breast exam (clinical breast exam or CBE) every year. Depending on your age, family history, and medical history, it may be recommended that you also have a yearly breast X-ray (mammogram).  If you have a family history of breast cancer, talk with your health care provider about genetic screening.  If you are at high risk for breast cancer, talk with your health care provider about having an MRI and a mammogram every year.  Breast cancer (BRCA) gene test is recommended for women who have family members with BRCA-related cancers. Results of the assessment will determine the need for genetic counseling and BRCA1 and for BRCA2 testing. BRCA-related cancers include these types: ? Breast. This occurs in males or females. ? Ovarian. ? Tubal. This may also be called fallopian tube cancer. ? Cancer of the abdominal or pelvic lining (peritoneal cancer). ? Prostate. ? Pancreatic.  Cervical, Uterine, and Ovarian Cancer Your health care provider may recommend that you be screened regularly for cancer of the pelvic organs. These include your ovaries, uterus, and vagina. This screening involves a pelvic exam, which includes checking for microscopic changes to the surface of your cervix (Pap test).  For women ages 21-65, health care  providers may recommend a pelvic exam and a Pap test every three years. For women ages 22-65, they may recommend the Pap test and pelvic exam, combined with testing for human papilloma virus (HPV), every five years. Some types of HPV increase your risk of cervical cancer. Testing for HPV may also be done on women of any age who have unclear Pap test results.  Other health care providers may not recommend any screening for nonpregnant women who are considered low risk for pelvic cancer and have no symptoms. Ask your health care provider if a screening pelvic exam is right for you.  If you have had past treatment for cervical cancer or a condition that could lead to cancer, you need Pap tests and screening for cancer for at least 20 years after your treatment. If Pap tests have been discontinued for you, your risk factors (such as having a new sexual partner) need to be reassessed to determine if you should start having screenings again. Some women have medical problems that increase the chance of getting cervical cancer. In these cases, your health care provider may recommend that you have screening and Pap tests more often.  If you have a family history of uterine cancer or ovarian cancer, talk with your health care provider about genetic screening.  If you have vaginal bleeding after reaching menopause, tell your health care provider.  There are currently no reliable tests available  to screen for ovarian cancer.  Lung Cancer Lung cancer screening is recommended for adults 76-92 years old who are at high risk for lung cancer because of a history of smoking. A yearly low-dose CT scan of the lungs is recommended if you:  Currently smoke.  Have a history of at least 30 pack-years of smoking and you currently smoke or have quit within the past 15 years. A pack-year is smoking an average of one pack of cigarettes per day for one year.  Yearly screening should:  Continue until it has been 15 years  since you quit.  Stop if you develop a health problem that would prevent you from having lung cancer treatment.  Colorectal Cancer  This type of cancer can be detected and can often be prevented.  Routine colorectal cancer screening usually begins at age 66 and continues through age 68.  If you have risk factors for colon cancer, your health care provider may recommend that you be screened at an earlier age.  If you have a family history of colorectal cancer, talk with your health care provider about genetic screening.  Your health care provider may also recommend using home test kits to check for hidden blood in your stool.  A small camera at the end of a tube can be used to examine your colon directly (sigmoidoscopy or colonoscopy). This is done to check for the earliest forms of colorectal cancer.  Direct examination of the colon should be repeated every 5-10 years until age 19. However, if early forms of precancerous polyps or small growths are found or if you have a family history or genetic risk for colorectal cancer, you may need to be screened more often.  Skin Cancer  Check your skin from head to toe regularly.  Monitor any moles. Be sure to tell your health care provider: ? About any new moles or changes in moles, especially if there is a change in a mole's shape or color. ? If you have a mole that is larger than the size of a pencil eraser.  If any of your family members has a history of skin cancer, especially at a young age, talk with your health care provider about genetic screening.  Always use sunscreen. Apply sunscreen liberally and repeatedly throughout the day.  Whenever you are outside, protect yourself by wearing long sleeves, pants, a wide-brimmed hat, and sunglasses.  What should I know about osteoporosis? Osteoporosis is a condition in which bone destruction happens more quickly than new bone creation. After menopause, you may be at an increased risk for  osteoporosis. To help prevent osteoporosis or the bone fractures that can happen because of osteoporosis, the following is recommended:  If you are 67-17 years old, get at least 1,000 mg of calcium and at least 600 mg of vitamin D per day.  If you are older than age 49 but younger than age 5, get at least 1,200 mg of calcium and at least 600 mg of vitamin D per day.  If you are older than age 34, get at least 1,200 mg of calcium and at least 800 mg of vitamin D per day.  Smoking and excessive alcohol intake increase the risk of osteoporosis. Eat foods that are rich in calcium and vitamin D, and do weight-bearing exercises several times each week as directed by your health care provider. What should I know about how menopause affects my mental health? Depression may occur at any age, but it is more common as  you become older. Common symptoms of depression include:  Low or sad mood.  Changes in sleep patterns.  Changes in appetite or eating patterns.  Feeling an overall lack of motivation or enjoyment of activities that you previously enjoyed.  Frequent crying spells.  Talk with your health care provider if you think that you are experiencing depression. What should I know about immunizations? It is important that you get and maintain your immunizations. These include:  Tetanus, diphtheria, and pertussis (Tdap) booster vaccine.  Influenza every year before the flu season begins.  Pneumonia vaccine.  Shingles vaccine.  Your health care provider may also recommend other immunizations. This information is not intended to replace advice given to you by your health care provider. Make sure you discuss any questions you have with your health care provider. Document Released: 08/31/2005 Document Revised: 01/27/2016 Document Reviewed: 04/12/2015 Elsevier Interactive Patient Education  2018 [employer].

## 2017-09-11 DIAGNOSIS — M7711 Lateral epicondylitis, right elbow: Secondary | ICD-10-CM | POA: Diagnosis not present

## 2017-09-11 DIAGNOSIS — M25521 Pain in right elbow: Secondary | ICD-10-CM | POA: Diagnosis not present

## 2017-09-16 DIAGNOSIS — X32XXXD Exposure to sunlight, subsequent encounter: Secondary | ICD-10-CM | POA: Diagnosis not present

## 2017-09-16 DIAGNOSIS — L570 Actinic keratosis: Secondary | ICD-10-CM | POA: Diagnosis not present

## 2017-09-16 DIAGNOSIS — L820 Inflamed seborrheic keratosis: Secondary | ICD-10-CM | POA: Diagnosis not present

## 2017-10-18 DIAGNOSIS — R300 Dysuria: Secondary | ICD-10-CM | POA: Diagnosis not present

## 2017-10-18 DIAGNOSIS — N390 Urinary tract infection, site not specified: Secondary | ICD-10-CM | POA: Diagnosis not present

## 2017-10-18 DIAGNOSIS — A499 Bacterial infection, unspecified: Secondary | ICD-10-CM | POA: Diagnosis not present

## 2017-11-15 DIAGNOSIS — L820 Inflamed seborrheic keratosis: Secondary | ICD-10-CM | POA: Diagnosis not present

## 2017-11-15 DIAGNOSIS — X32XXXD Exposure to sunlight, subsequent encounter: Secondary | ICD-10-CM | POA: Diagnosis not present

## 2017-11-15 DIAGNOSIS — L570 Actinic keratosis: Secondary | ICD-10-CM | POA: Diagnosis not present

## 2017-11-27 DIAGNOSIS — N390 Urinary tract infection, site not specified: Secondary | ICD-10-CM | POA: Diagnosis not present

## 2017-11-27 DIAGNOSIS — H01003 Unspecified blepharitis right eye, unspecified eyelid: Secondary | ICD-10-CM | POA: Diagnosis not present

## 2017-11-27 DIAGNOSIS — R350 Frequency of micturition: Secondary | ICD-10-CM | POA: Diagnosis not present

## 2017-12-13 DIAGNOSIS — N183 Chronic kidney disease, stage 3 (moderate): Secondary | ICD-10-CM | POA: Diagnosis not present

## 2017-12-13 DIAGNOSIS — R8290 Unspecified abnormal findings in urine: Secondary | ICD-10-CM | POA: Diagnosis not present

## 2017-12-13 DIAGNOSIS — E785 Hyperlipidemia, unspecified: Secondary | ICD-10-CM | POA: Diagnosis not present

## 2017-12-13 DIAGNOSIS — M810 Age-related osteoporosis without current pathological fracture: Secondary | ICD-10-CM | POA: Diagnosis not present

## 2017-12-25 DIAGNOSIS — R300 Dysuria: Secondary | ICD-10-CM | POA: Diagnosis not present

## 2017-12-25 DIAGNOSIS — A499 Bacterial infection, unspecified: Secondary | ICD-10-CM | POA: Diagnosis not present

## 2017-12-25 DIAGNOSIS — N390 Urinary tract infection, site not specified: Secondary | ICD-10-CM | POA: Diagnosis not present

## 2018-03-31 DIAGNOSIS — L820 Inflamed seborrheic keratosis: Secondary | ICD-10-CM | POA: Diagnosis not present

## 2018-03-31 DIAGNOSIS — X32XXXD Exposure to sunlight, subsequent encounter: Secondary | ICD-10-CM | POA: Diagnosis not present

## 2018-03-31 DIAGNOSIS — L570 Actinic keratosis: Secondary | ICD-10-CM | POA: Diagnosis not present

## 2018-04-08 DIAGNOSIS — Z23 Encounter for immunization: Secondary | ICD-10-CM | POA: Diagnosis not present

## 2018-05-26 DIAGNOSIS — X32XXXD Exposure to sunlight, subsequent encounter: Secondary | ICD-10-CM | POA: Diagnosis not present

## 2018-05-26 DIAGNOSIS — L570 Actinic keratosis: Secondary | ICD-10-CM | POA: Diagnosis not present

## 2018-06-27 DIAGNOSIS — Z1231 Encounter for screening mammogram for malignant neoplasm of breast: Secondary | ICD-10-CM | POA: Diagnosis not present

## 2018-07-28 ENCOUNTER — Ambulatory Visit (INDEPENDENT_AMBULATORY_CARE_PROVIDER_SITE_OTHER): Payer: Medicare Other | Admitting: Podiatry

## 2018-07-28 ENCOUNTER — Encounter: Payer: Self-pay | Admitting: Podiatry

## 2018-07-28 ENCOUNTER — Ambulatory Visit (INDEPENDENT_AMBULATORY_CARE_PROVIDER_SITE_OTHER): Payer: Medicare Other

## 2018-07-28 ENCOUNTER — Other Ambulatory Visit: Payer: Self-pay | Admitting: Podiatry

## 2018-07-28 DIAGNOSIS — M7671 Peroneal tendinitis, right leg: Secondary | ICD-10-CM

## 2018-07-28 DIAGNOSIS — M25571 Pain in right ankle and joints of right foot: Secondary | ICD-10-CM

## 2018-07-28 MED ORDER — TRIAMCINOLONE ACETONIDE 10 MG/ML IJ SUSP
10.0000 mg | Freq: Once | INTRAMUSCULAR | Status: AC
  Administered 2018-07-28: 10 mg

## 2018-08-04 NOTE — Progress Notes (Signed)
Subjective:   Patient ID: Kelly Keith, female   DOB: 73 y.o.   MRN: [id number]   HPI Patient presents with pain on top of the side of the right foot with inflammation noted and inability to wear shoe gear with any degree of comfort.  Patient did injure it at the end of November when putting up Christmas decorations and it seemed to hurt after this   ROS      Objective:  Physical Exam  Neurovascular status intact with inflammation pain on the dorsal lateral side of the right foot around the peroneal complex and around peroneal tertius with no indications of muscle strength loss     Assessment:  Acute peroneal tendinitis right with possibility for bone injury     Plan:  H&P condition reviewed and careful dorsal injection administered 3 mg Kenalog 5 mg Xylocaine and advised on ice therapy and applied fascial brace to reduce stress on the lateral side of the foot and instructed on usage of the device.  Reappoint to recheck 3 weeks or earlier if needed  X-ray indicates no signs of there is a fracture or any kind of bone injury or arthritis associated with this

## 2018-08-12 ENCOUNTER — Encounter: Payer: Self-pay | Admitting: Obstetrics & Gynecology

## 2018-08-12 ENCOUNTER — Ambulatory Visit (INDEPENDENT_AMBULATORY_CARE_PROVIDER_SITE_OTHER): Payer: Medicare Other | Admitting: Obstetrics & Gynecology

## 2018-08-12 VITALS — BP 140/80 | Ht 68.0 in | Wt 158.6 lb

## 2018-08-12 DIAGNOSIS — Z01419 Encounter for gynecological examination (general) (routine) without abnormal findings: Secondary | ICD-10-CM | POA: Diagnosis not present

## 2018-08-12 DIAGNOSIS — Z90710 Acquired absence of both cervix and uterus: Secondary | ICD-10-CM

## 2018-08-12 DIAGNOSIS — Z780 Asymptomatic menopausal state: Secondary | ICD-10-CM

## 2018-08-12 NOTE — Patient Instructions (Signed)
1. Well female exam with routine gynecological exam Gynecologic exam status post total hysterectomy and menopause.  Pap test negative in 2017.  No indication to repeat a Pap test this year.  Breast exam normal.  Screening mammogram in December 2019 was negative.  Will obtain report from [facility].  Fasting health labs with Dr. Rex Kras.  Will do Cologuard with Dr. Rex Kras this year.  Declines bone density after counseling.  Taking calcium and vitamin D supplements through a bone health supplement.  Walking regularly.  Good body mass index at 24.12.  Good fitness and healthy nutrition.  2. S/P total hysterectomy  3. Postmenopausal Well on no hormone replacement therapy.  Kelly Keith, it was a pleasure seeing you today!

## 2018-08-12 NOTE — Progress Notes (Signed)
Kelly Keith [birth year] [id number]   History:    73 y.o. G1P1L1 Married  RP:  Established patient presenting for annual gyn exam   HPI: Postmenopausal, well on no HRT.  S/P Total Hysterectomy.  No pelvic pain.  Abstinent.  Breasts normal.  BMI 24.12.  Physically active, walks regularly. Urine/BMs normal. Health labs with Dr Rex Kras.    Past medical history,surgical history, family history and social history were all reviewed and documented in the EPIC chart.  Gynecologic History No LMP recorded. Patient has had a hysterectomy. Contraception: status post hysterectomy Last Pap: 2017. Results were: Negative Last mammogram: 06/2018. Results were: Negative.  Will obtain from [employer]. Bone Density: 2015.  Declines after counseling Colonoscopy: 2010.  Will do Cologard this year  Obstetric History OB History  Gravida Para Term Preterm AB Living  1 1       1   SAB TAB Ectopic Multiple Live Births               # Outcome Date GA Lbr Len/2nd Weight Sex Delivery Anes PTL Lv  1 Para              ROS: A ROS was performed and pertinent positives and negatives are included in the history.  GENERAL: No fevers or chills. HEENT: No change in vision, no earache, sore throat or sinus congestion. NECK: No pain or stiffness. CARDIOVASCULAR: No chest pain or pressure. No palpitations. PULMONARY: No shortness of breath, cough or wheeze. GASTROINTESTINAL: No abdominal pain, nausea, vomiting or diarrhea, melena or bright red blood per rectum. GENITOURINARY: No urinary frequency, urgency, hesitancy or dysuria. MUSCULOSKELETAL: No joint or muscle pain, no back pain, no recent trauma. DERMATOLOGIC: No rash, no itching, no lesions. ENDOCRINE: No polyuria, polydipsia, no heat or cold intolerance. No recent change in weight. HEMATOLOGICAL: No anemia or easy bruising or bleeding. NEUROLOGIC: No headache, seizures, numbness, tingling or weakness. PSYCHIATRIC: No depression, no loss of interest in normal  activity or change in sleep pattern.     Exam:   BP 140/80   Ht 5\' 8"  (1.727 m)   Wt 158 lb 9.6 oz (71.9 kg)   BMI 24.12 kg/m   Body mass index is 24.12 kg/m.  General appearance : Well developed well nourished female. No acute distress HEENT: Eyes: no retinal hemorrhage or exudates,  Neck supple, trachea midline, no carotid bruits, no thyroidmegaly Lungs: Clear to auscultation, no rhonchi or wheezes, or rib retractions  Heart: Regular rate and rhythm, no murmurs or gallops Breast:Examined in sitting and supine position were symmetrical in appearance, no palpable masses or tenderness,  no skin retraction, no nipple inversion, no nipple discharge, no skin discoloration, no axillary or supraclavicular lymphadenopathy Abdomen: no palpable masses or tenderness, no rebound or guarding Extremities: no edema or skin discoloration or tenderness  Pelvic: Vulva: Normal             Vagina: No gross lesions or discharge  Cervix/Uterus absent  Adnexa  Without masses or tenderness  Anus: Normal   Assessment/Plan:  73 y.o. female for annual exam   1. Well female exam with routine gynecological exam Gynecologic exam status post total hysterectomy and menopause.  Pap test negative in 2017.  No indication to repeat a Pap test this year.  Breast exam normal.  Screening mammogram in December 2019 was negative.  Will obtain report from [facility].  Fasting health labs with Dr. Rex Kras.  Will do Cologuard with Dr. Rex Kras this year.  Declines  bone density after counseling.  Taking calcium and vitamin D supplements through a bone health supplement.  Walking regularly.  Good body mass index at 24.12.  Good fitness and healthy nutrition.  2. S/P total hysterectomy  3. Postmenopausal Well on no hormone replacement therapy.  [name] MD, 10:49 AM 08/12/2018

## 2018-09-05 DIAGNOSIS — H2513 Age-related nuclear cataract, bilateral: Secondary | ICD-10-CM | POA: Diagnosis not present

## 2018-09-05 DIAGNOSIS — H02052 Trichiasis without entropian right lower eyelid: Secondary | ICD-10-CM | POA: Diagnosis not present

## 2018-09-29 DIAGNOSIS — L570 Actinic keratosis: Secondary | ICD-10-CM | POA: Diagnosis not present

## 2018-09-29 DIAGNOSIS — X32XXXD Exposure to sunlight, subsequent encounter: Secondary | ICD-10-CM | POA: Diagnosis not present

## 2018-09-29 DIAGNOSIS — L821 Other seborrheic keratosis: Secondary | ICD-10-CM | POA: Diagnosis not present

## 2018-09-29 DIAGNOSIS — Z1283 Encounter for screening for malignant neoplasm of skin: Secondary | ICD-10-CM | POA: Diagnosis not present

## 2018-09-29 DIAGNOSIS — L820 Inflamed seborrheic keratosis: Secondary | ICD-10-CM | POA: Diagnosis not present

## 2018-12-26 DIAGNOSIS — E538 Deficiency of other specified B group vitamins: Secondary | ICD-10-CM | POA: Diagnosis not present

## 2018-12-26 DIAGNOSIS — Z79899 Other long term (current) drug therapy: Secondary | ICD-10-CM | POA: Diagnosis not present

## 2018-12-26 DIAGNOSIS — Z1211 Encounter for screening for malignant neoplasm of colon: Secondary | ICD-10-CM | POA: Diagnosis not present

## 2018-12-26 DIAGNOSIS — H6120 Impacted cerumen, unspecified ear: Secondary | ICD-10-CM | POA: Diagnosis not present

## 2018-12-26 DIAGNOSIS — N183 Chronic kidney disease, stage 3 (moderate): Secondary | ICD-10-CM | POA: Diagnosis not present

## 2018-12-26 DIAGNOSIS — I517 Cardiomegaly: Secondary | ICD-10-CM | POA: Diagnosis not present

## 2018-12-26 DIAGNOSIS — E785 Hyperlipidemia, unspecified: Secondary | ICD-10-CM | POA: Diagnosis not present

## 2018-12-26 DIAGNOSIS — M810 Age-related osteoporosis without current pathological fracture: Secondary | ICD-10-CM | POA: Diagnosis not present

## 2018-12-26 DIAGNOSIS — M899 Disorder of bone, unspecified: Secondary | ICD-10-CM | POA: Diagnosis not present

## 2019-01-21 DIAGNOSIS — L820 Inflamed seborrheic keratosis: Secondary | ICD-10-CM | POA: Diagnosis not present

## 2019-01-21 DIAGNOSIS — X32XXXD Exposure to sunlight, subsequent encounter: Secondary | ICD-10-CM | POA: Diagnosis not present

## 2019-01-21 DIAGNOSIS — L570 Actinic keratosis: Secondary | ICD-10-CM | POA: Diagnosis not present

## 2019-03-02 DIAGNOSIS — X32XXXD Exposure to sunlight, subsequent encounter: Secondary | ICD-10-CM | POA: Diagnosis not present

## 2019-03-02 DIAGNOSIS — L570 Actinic keratosis: Secondary | ICD-10-CM | POA: Diagnosis not present

## 2019-03-02 DIAGNOSIS — L820 Inflamed seborrheic keratosis: Secondary | ICD-10-CM | POA: Diagnosis not present

## 2019-04-01 DIAGNOSIS — Z23 Encounter for immunization: Secondary | ICD-10-CM | POA: Diagnosis not present

## 2019-04-17 DIAGNOSIS — E785 Hyperlipidemia, unspecified: Secondary | ICD-10-CM | POA: Diagnosis not present

## 2019-04-20 DIAGNOSIS — Z1211 Encounter for screening for malignant neoplasm of colon: Secondary | ICD-10-CM | POA: Diagnosis not present

## 2019-05-15 ENCOUNTER — Encounter: Payer: Self-pay | Admitting: Podiatry

## 2019-05-15 ENCOUNTER — Ambulatory Visit (INDEPENDENT_AMBULATORY_CARE_PROVIDER_SITE_OTHER): Payer: Medicare Other | Admitting: Podiatry

## 2019-05-15 ENCOUNTER — Other Ambulatory Visit: Payer: Self-pay

## 2019-05-15 DIAGNOSIS — M7671 Peroneal tendinitis, right leg: Secondary | ICD-10-CM | POA: Diagnosis not present

## 2019-05-15 DIAGNOSIS — M779 Enthesopathy, unspecified: Secondary | ICD-10-CM | POA: Diagnosis not present

## 2019-05-15 DIAGNOSIS — L600 Ingrowing nail: Secondary | ICD-10-CM

## 2019-05-18 DIAGNOSIS — L820 Inflamed seborrheic keratosis: Secondary | ICD-10-CM | POA: Diagnosis not present

## 2019-05-18 NOTE — Progress Notes (Signed)
Subjective:   Patient ID: Kelly Keith, female   DOB: 73 y.o.   MRN: [id number]   HPI Patient states she did really well for at least 6 months but has had a flareup of discomfort in the lateral side of the right foot with gait being worse   ROS      Objective:  Physical Exam  Neurovascular status intact with patient found to have inflammation pain of the lateral side of the right foot around the peroneal insertion into the base of the fifth metatarsal     Assessment:  Acute flareup of peroneal tendinitis right     Plan:  Reviewed condition did sterile prep of the area and injected the sheath of the peroneal tendon near its insertion 3 mg Dexasone Kenalog 5 mg Xylocaine advised on boot usage as needed and brace and ice therapy and supportive shoes and will be seen back as needed

## 2019-06-22 DIAGNOSIS — Z20828 Contact with and (suspected) exposure to other viral communicable diseases: Secondary | ICD-10-CM | POA: Diagnosis not present

## 2019-06-23 DIAGNOSIS — Z20828 Contact with and (suspected) exposure to other viral communicable diseases: Secondary | ICD-10-CM | POA: Diagnosis not present

## 2019-06-30 DIAGNOSIS — I868 Varicose veins of other specified sites: Secondary | ICD-10-CM | POA: Diagnosis not present

## 2019-07-30 ENCOUNTER — Other Ambulatory Visit (INDEPENDENT_AMBULATORY_CARE_PROVIDER_SITE_OTHER): Payer: Self-pay | Admitting: Nurse Practitioner

## 2019-07-30 DIAGNOSIS — I83819 Varicose veins of unspecified lower extremities with pain: Secondary | ICD-10-CM

## 2019-08-03 ENCOUNTER — Other Ambulatory Visit: Payer: Self-pay

## 2019-08-03 ENCOUNTER — Ambulatory Visit (INDEPENDENT_AMBULATORY_CARE_PROVIDER_SITE_OTHER): Payer: Medicare Other | Admitting: Nurse Practitioner

## 2019-08-03 ENCOUNTER — Encounter (INDEPENDENT_AMBULATORY_CARE_PROVIDER_SITE_OTHER): Payer: Self-pay | Admitting: Nurse Practitioner

## 2019-08-03 ENCOUNTER — Encounter (INDEPENDENT_AMBULATORY_CARE_PROVIDER_SITE_OTHER): Payer: Self-pay

## 2019-08-03 ENCOUNTER — Ambulatory Visit (INDEPENDENT_AMBULATORY_CARE_PROVIDER_SITE_OTHER): Payer: Medicare Other

## 2019-08-03 VITALS — BP 149/79 | HR 85 | Resp 18 | Ht 68.0 in | Wt 159.0 lb

## 2019-08-03 DIAGNOSIS — I83819 Varicose veins of unspecified lower extremities with pain: Secondary | ICD-10-CM | POA: Diagnosis not present

## 2019-08-03 DIAGNOSIS — M7122 Synovial cyst of popliteal space [Baker], left knee: Secondary | ICD-10-CM | POA: Diagnosis not present

## 2019-08-03 DIAGNOSIS — I872 Venous insufficiency (chronic) (peripheral): Secondary | ICD-10-CM | POA: Diagnosis not present

## 2019-08-03 NOTE — Progress Notes (Signed)
SUBJECTIVE:  Patient ID: Kelly Keith, female    DOB: [birth year], 75 y.o.   MRN: [id number] Chief Complaint  Patient presents with  . New Patient (Initial Visit)    HPI  Kelly Keith is a 74 y.o. female that presents to our office as a new patient from a referral with Dr. Rex Kras.  The patient is concerned due to the fact that she recently had some swelling and discomfort behind her left knee.  The patient has had some spider veins for several years however she does notice the swelling and associated aching.  She was concerned that there could be some worsening of varicose veins or formation of a blood clot.  She denies previously wearing compression socks.  She denies the pain worsening throughout the day.  She describes it as a dull ache.  She denies any other conservative therapy such as elevation of lower extremities or strenuous exercise.  She denies any fever, chills, nausea, vomiting or diarrhea.  She denies any swelling of the lower extremity.  She denies any burning stinging sensation.  Noninvasive studies show evidence of chronic venous insufficiency detected in the right great saphenous vein at the proximal thigh and proximal calf.  The diameter ranges from 0.33 to 0.44 cm.  There is also chronic venous insufficiency detected in the left great saphenous vein at the proximal calf with a diameter of 0.18 cm.  There is no evidence of DVT bilaterally there is no evidence of superficial venous thrombosis bilaterally.  There is also a Baker's cyst seen behind the left knee area measuring 2.8 cm x 1.2 cm.  Past Medical History:  Diagnosis Date  . Arthritis   . Cancer ([place]) 2016   left lower eye lid  . GERD (gastroesophageal reflux disease)    occas  . Motion sickness    cars  . Osteoporosis   . Shoulder pain, right   . Vertigo    in past    Past Surgical History:  Procedure Laterality Date  . ABDOMINAL HYSTERECTOMY    . AMPUTATION TOE Right 1980's   right 2nd toe due to  infection  . BROW LIFT Bilateral 08/30/2015   Procedure: BLEPHAROPLASTY;  Surgeon: Karle Starch, MD;  Location: [facility];  Service: Ophthalmology;  Laterality: Bilateral;  . ECTROPION REPAIR Right 08/30/2015   Procedure: REPAIR OF ECTROPION;  Surgeon: Karle Starch, MD;  Location: [facility];  Service: Ophthalmology;  Laterality: Right;  . EYE SURGERY    . FOOT SURGERY    . PTOSIS REPAIR Bilateral 08/30/2015   Procedure: PTOSIS REPAIR;  Surgeon: Karle Starch, MD;  Location: [facility];  Service: Ophthalmology;  Laterality: Bilateral;  eyelids  . TONSILLECTOMY AND ADENOIDECTOMY      Social History   Socioeconomic History  . Marital status: Married    Spouse name: Not on file  . Number of children: Not on file  . Years of education: Not on file  . Highest education level: Not on file  Occupational History  . Not on file  Tobacco Use  . Smoking status: Never Smoker  . Smokeless tobacco: Never Used  Substance and Sexual Activity  . Alcohol use: No    Alcohol/week: 0.0 standard drinks  . Drug use: Never  . Sexual activity: Not Currently    Comment: 1st intercourse- 26, married- 71 yrs   Other Topics Concern  . Not on file  Social History Narrative   Married, has one daughter. Currently  works as a Furniture conservator/restorer Strain:   . Difficulty of Paying Living Expenses: Not on file  Food Insecurity:   . Worried About Charity fundraiser in the Last Year: Not on file  . Ran Out of Food in the Last Year: Not on file  Transportation Needs:   . Lack of Transportation (Medical): Not on file  . Lack of Transportation (Non-Medical): Not on file  Physical Activity:   . Days of Exercise per Week: Not on file  . Minutes of Exercise per Session: Not on file  Stress:   . Feeling of Stress : Not on file  Social Connections:   . Frequency of Communication with Friends and Family: Not on file  . Frequency of Social  Gatherings with Friends and Family: Not on file  . Attends Religious Services: Not on file  . Active Member of Clubs or Organizations: Not on file  . Attends Archivist Meetings: Not on file  . Marital Status: Not on file  Intimate Partner Violence:   . Fear of Current or Ex-Partner: Not on file  . Emotionally Abused: Not on file  . Physically Abused: Not on file  . Sexually Abused: Not on file    Family History  Problem Relation Age of Onset  . Heart disease Mother   . Arthritis Mother   . Anxiety disorder Mother   . Osteoporosis Mother   . Lung cancer Father   . Congestive Heart Failure Father   . High blood pressure Father     Allergies  Allergen Reactions  . Septra [Sulfamethoxazole-Trimethoprim] Rash and Other (See Comments)    Other reaction(s): Unknown     Review of Systems   Review of Systems: Negative Unless Checked Constitutional: [] Weight loss  [] Fever  [] Chills Cardiac: [] Chest pain   []  Atrial Fibrillation  [] Palpitations   [] Shortness of breath when laying flat   [] Shortness of breath with exertion. [] Shortness of breath at rest Vascular:  [] Pain in legs with walking   [x] Pain in legs with standing [] Pain in legs when laying flat   [] Claudication    [] Pain in feet when laying flat    [] History of DVT   [] Phlebitis   [] Swelling in legs   [] Varicose veins   [] Non-healing ulcers Pulmonary:   [] Uses home oxygen   [] Productive cough   [] Hemoptysis   [] Wheeze  [] COPD   [] Asthma Neurologic:  [] Dizziness   [] Seizures  [] Blackouts [] History of stroke   [] History of TIA  [] Aphasia   [] Temporary Blindness   [] Weakness or numbness in arm   [] Weakness or numbness in leg Musculoskeletal:   [] Joint swelling   [] Joint pain   [] Low back pain  []  History of Knee Replacement [x] Arthritis [] back Surgeries  []  Spinal Stenosis    Hematologic:  [] Easy bruising  [] Easy bleeding   [] Hypercoagulable state   [] Anemic Gastrointestinal:  [] Diarrhea   [] Vomiting  [x] Gastroesophageal  reflux/heartburn   [] Difficulty swallowing. [] Abdominal pain Genitourinary:  [] Chronic kidney disease   [] Difficult urination  [] Anuric   [] Blood in urine [] Frequent urination  [] Burning with urination   [] Hematuria Skin:  [] Rashes   [] Ulcers [] Wounds Psychological:  [] History of anxiety   []  History of major depression  []  Memory Difficulties      OBJECTIVE:   Physical Exam  BP (!) 149/79 (BP Location: Left Arm)   Pulse 85   Resp 18   Ht 5\' 8"  (1.727 m)  Wt 159 lb (72.1 kg)   BMI 24.18 kg/m   Gen: WD/WN, NAD Head: [place]/AT, No temporalis wasting.  Ear/Nose/Throat: Hearing grossly intact, nares w/o erythema or drainage Eyes: PER, EOMI, sclera nonicteric.  Neck: Supple, no masses.  No JVD.  Pulmonary:  Good air movement, no use of accessory muscles.  Cardiac: RRR Vascular:  Scattered varicosities bilaterally.  Slightly palpable mass behind left knee Vessel Right Left  Radial Palpable Palpable   Gastrointestinal: soft, non-distended. No guarding/no peritoneal signs.  Musculoskeletal: M/S 5/5 throughout.  No deformity or atrophy.  Neurologic: Pain and light touch intact in extremities.  Symmetrical.  Speech is fluent. Motor exam as listed above. Psychiatric: Judgment intact, Mood & affect appropriate for pt's clinical situation. Dermatologic: No Venous rashes. No Ulcers Noted.  No changes consistent with cellulitis. Lymph : No Cervical lymphadenopathy, no lichenification or skin changes of chronic lymphedema.       ASSESSMENT AND PLAN:  1. Chronic venous insufficiency Recommend:  The patient is complaining of varicose veins.    I have had a long discussion with the patient regarding  varicose veins and why they cause symptoms.  Patient will begin wearing graduated compression stockings on a daily basis, beginning first thing in the morning and removing them in the evening. The patient is instructed specifically not to sleep in the stockings.    The patient  will also begin  using over-the-counter analgesics such as Motrin 600 mg po TID to help control the symptoms as needed.    In addition, behavioral modification including elevation during the day will be initiated, utilizing a recliner was recommended.  The patient is also instructed to continue exercising such as walking 4-5 times per week.  At this time the patient wishes to continue conservative therapy and is not interested in more invasive treatments such as laser ablation and sclerotherapy.  The Patient will follow up PRN if the symptoms worsen.  2. Baker's cyst of knee, left Based on the patient's information it is likely that her pain and discomfort is related to her Baker's cyst behind the left knee.  I have asked the patient to follow-up with her current orthopedic doctor, Dr. Eliberto Ivory if it becomes worse or unbearable.  Otherwise she can try to utilize ice or ibuprofen/Tylenol for pain control.  Will defer further work-up and management to the primary care provider.   Current Outpatient Medications on File Prior to Visit  Medication Sig Dispense Refill  . clobetasol cream (TEMOVATE) [id number] % Apply 1 application topically 2 (two) times daily.    . Coenzyme Q10 (COQ10 PO) Take by mouth daily.    . fexofenadine (ALLEGRA) 180 MG tablet Take by mouth.    Marland Kitchen GARLIC PO Take by mouth daily.    . Ginger, Zingiber officinalis, (GINGER PO) Take by mouth daily.    Marland Kitchen ketoconazole (NIZORAL) 2 % cream Apply 1 application topically daily as needed.     . Lutein 6 MG CAPS Take by mouth.    . Milk Thistle Extract 87.5 MG CAPS Take by mouth.    . Multiple Vitamin (MULTIVITAMIN) tablet Take 1 tablet by mouth daily.    . Nutritional Supplements (CARDIO COMPLETE PO) Take by mouth daily.    Marland Kitchen OVER THE COUNTER MEDICATION daily. Joint advantage    . OVER THE COUNTER MEDICATION 2 (two) times daily. True osteo (bone health)    . pantoprazole (PROTONIX) 40 MG tablet Take 1 tablet (40 mg total) by mouth daily. Take 30-60 min before  first meal of the day 30 tablet 2  . Papaya CHEW Chew by mouth 2 (two) times daily.    . Red Yeast Rice Extract (RED YEAST RICE PO) Take 600 mg by mouth 2 (two) times daily.    . Turmeric POWD Take by mouth.    Marland Kitchen FLAXSEED, LINSEED, PO Take by mouth daily.    . Probiotic Product (PROBIOTIC DAILY PO) Take by mouth daily.     No current facility-administered medications on file prior to visit.    There are no Patient Instructions on file for this visit. No follow-ups on file.   Kris Hartmann, NP  This note was completed with Sales executive.  Any errors are purely unintentional.

## 2019-08-13 DIAGNOSIS — Z1231 Encounter for screening mammogram for malignant neoplasm of breast: Secondary | ICD-10-CM | POA: Diagnosis not present

## 2019-08-20 ENCOUNTER — Other Ambulatory Visit: Payer: Self-pay

## 2019-08-21 ENCOUNTER — Encounter: Payer: Self-pay | Admitting: Obstetrics & Gynecology

## 2019-08-21 ENCOUNTER — Ambulatory Visit (INDEPENDENT_AMBULATORY_CARE_PROVIDER_SITE_OTHER): Payer: Medicare Other | Admitting: Obstetrics & Gynecology

## 2019-08-21 VITALS — BP 140/82 | Ht 68.0 in | Wt 160.0 lb

## 2019-08-21 DIAGNOSIS — Z01419 Encounter for gynecological examination (general) (routine) without abnormal findings: Secondary | ICD-10-CM | POA: Diagnosis not present

## 2019-08-21 DIAGNOSIS — Z90710 Acquired absence of both cervix and uterus: Secondary | ICD-10-CM

## 2019-08-21 DIAGNOSIS — Z780 Asymptomatic menopausal state: Secondary | ICD-10-CM

## 2019-08-21 NOTE — Progress Notes (Signed)
Kelly Keith 26-Aug-1945 [id number]   History:    74 y.o. G1P1L1 Married  RP:  Established patient presenting for annual gyn exam   HPI: Postmenopausal, well on no HRT.  S/P Total Hysterectomy.  No pelvic pain.  Abstinent.  Breasts normal.  BMI 24.33.  Physically active, walks regularly. Urine/BMs normal. Health labs with Dr Rex Kras.     Past medical history,surgical history, family history and social history were all reviewed and documented in the EPIC chart.  Gynecologic History No LMP recorded. Patient has had a hysterectomy.  Obstetric History OB History  Gravida Para Term Preterm AB Living  1 1       1   SAB TAB Ectopic Multiple Live Births               # Outcome Date GA Lbr Len/2nd Weight Sex Delivery Anes PTL Lv  1 Para              ROS: A ROS was performed and pertinent positives and negatives are included in the history.  GENERAL: No fevers or chills. HEENT: No change in vision, no earache, sore throat or sinus congestion. NECK: No pain or stiffness. CARDIOVASCULAR: No chest pain or pressure. No palpitations. PULMONARY: No shortness of breath, cough or wheeze. GASTROINTESTINAL: No abdominal pain, nausea, vomiting or diarrhea, melena or bright red blood per rectum. GENITOURINARY: No urinary frequency, urgency, hesitancy or dysuria. MUSCULOSKELETAL: No joint or muscle pain, no back pain, no recent trauma. DERMATOLOGIC: No rash, no itching, no lesions. ENDOCRINE: No polyuria, polydipsia, no heat or cold intolerance. No recent change in weight. HEMATOLOGICAL: No anemia or easy bruising or bleeding. NEUROLOGIC: No headache, seizures, numbness, tingling or weakness. PSYCHIATRIC: No depression, no loss of interest in normal activity or change in sleep pattern.     Exam:   BP 140/82 (BP Location: Right Arm, Patient Position: Sitting, Cuff Size: Normal)   Ht 5\' 8"  (1.727 m)   Wt 160 lb (72.6 kg)   BMI 24.33 kg/m   Body mass index is 24.33 kg/m.  General appearance :  Well developed well nourished female. No acute distress HEENT: Eyes: no retinal hemorrhage or exudates,  Neck supple, trachea midline, no carotid bruits, no thyroidmegaly Lungs: Clear to auscultation, no rhonchi or wheezes, or rib retractions  Heart: Regular rate and rhythm, no murmurs or gallops Breast:Examined in sitting and supine position were symmetrical in appearance, no palpable masses or tenderness,  no skin retraction, no nipple inversion, no nipple discharge, no skin discoloration, no axillary or supraclavicular lymphadenopathy Abdomen: no palpable masses or tenderness, no rebound or guarding Extremities: no edema or skin discoloration or tenderness  Pelvic: Vulva: Normal             Vagina: No gross lesions or discharge  Cervix/Uterus absent  Adnexa  Without masses or tenderness  Anus: Normal   Assessment/Plan:  74 y.o. female for annual exam   1. Well female exam with routine gynecological exam Gynecologic exam status post total hysterectomy and menopause.  Last Pap test normal in 2017, no indication to repeat.  Breast exam normal.  Recent screening mammogram normal per patient at [facility], will obtain report.  Body mass index 24.33.  Continue with fitness and healthy nutrition.  Health labs with family physician Dr. Rex Kras. Organize colonoscopy when due through Dr Rex Kras.  2. S/P total hysterectomy  3. Postmenopausal Well on no hormone replacement therapy.  Last bone density in December 2015 showed osteopenia with a T  score of -1.7 at the left femoral neck.  Recommend a repeat bone density now.  Vitamin D supplements, calcium 1200 mg daily and regular weightbearing physical activities to continue.  [name] MD, 11:08 AM 08/21/2019

## 2019-08-21 NOTE — Patient Instructions (Signed)
1. Well female exam with routine gynecological exam Gynecologic exam status post total hysterectomy and menopause.  Last Pap test normal in 2017, no indication to repeat.  Breast exam normal.  Recent screening mammogram normal per patient at [facility], will obtain report.  Body mass index 24.33.  Continue with fitness and healthy nutrition.  Health labs with family physician Dr. Rex Kras. Organize colonoscopy when due through Dr Rex Kras.  2. S/P total hysterectomy  3. Postmenopausal Well on no hormone replacement therapy.  Last bone density in December 2015 showed osteopenia with a T score of -1.7 at the left femoral neck.  Recommend a repeat bone density now.  Vitamin D supplements, calcium 1200 mg daily and regular weightbearing physical activities to continue.  Kelly Keith, it was a pleasure seeing you today!

## 2019-09-02 DIAGNOSIS — L570 Actinic keratosis: Secondary | ICD-10-CM | POA: Diagnosis not present

## 2019-09-02 DIAGNOSIS — B353 Tinea pedis: Secondary | ICD-10-CM | POA: Diagnosis not present

## 2019-09-02 DIAGNOSIS — X32XXXD Exposure to sunlight, subsequent encounter: Secondary | ICD-10-CM | POA: Diagnosis not present

## 2019-09-02 DIAGNOSIS — L308 Other specified dermatitis: Secondary | ICD-10-CM | POA: Diagnosis not present

## 2019-09-02 DIAGNOSIS — L820 Inflamed seborrheic keratosis: Secondary | ICD-10-CM | POA: Diagnosis not present

## 2019-09-02 DIAGNOSIS — Z1283 Encounter for screening for malignant neoplasm of skin: Secondary | ICD-10-CM | POA: Diagnosis not present

## 2019-09-18 ENCOUNTER — Other Ambulatory Visit: Payer: Self-pay

## 2019-09-18 ENCOUNTER — Encounter: Payer: Self-pay | Admitting: Podiatry

## 2019-09-18 ENCOUNTER — Ambulatory Visit (INDEPENDENT_AMBULATORY_CARE_PROVIDER_SITE_OTHER): Payer: Medicare Other | Admitting: Podiatry

## 2019-09-18 DIAGNOSIS — M779 Enthesopathy, unspecified: Secondary | ICD-10-CM | POA: Diagnosis not present

## 2019-09-18 DIAGNOSIS — M722 Plantar fascial fibromatosis: Secondary | ICD-10-CM | POA: Diagnosis not present

## 2019-09-18 NOTE — Progress Notes (Signed)
Subjective:   Patient ID: Kelly Keith, female   DOB: 74 y.o.   MRN: [id number]   HPI Patient presents with pain in the heel right and also in the ankle right stating both areas are hurting her and its been around 4 months since we have seen her   ROS      Objective:  Physical Exam  Neurovascular status good  Exquisite discomfort in the sinus tarsi right and into the plantar fascial right at the insertional point of the tendon into the calcaneus     Assessment:  Sinus tarsitis right along with plantar fasciitis right     Plan:  H&P reviewed both conditions sterile prep done in both spots and I injected the sinus tarsi right 3 mg Kenalog 5 mg Xylocaine in the plantar fascial right 3 mg Kenalog 5 mg Xylocaine and advised on supportive therapy and reappoint as symptoms indicate

## 2019-10-09 DIAGNOSIS — H2513 Age-related nuclear cataract, bilateral: Secondary | ICD-10-CM | POA: Diagnosis not present

## 2019-10-09 DIAGNOSIS — H02052 Trichiasis without entropian right lower eyelid: Secondary | ICD-10-CM | POA: Diagnosis not present

## 2019-10-28 DIAGNOSIS — Z23 Encounter for immunization: Secondary | ICD-10-CM | POA: Diagnosis not present

## 2019-11-11 DIAGNOSIS — L820 Inflamed seborrheic keratosis: Secondary | ICD-10-CM | POA: Diagnosis not present

## 2019-11-11 DIAGNOSIS — L821 Other seborrheic keratosis: Secondary | ICD-10-CM | POA: Diagnosis not present

## 2019-11-11 DIAGNOSIS — L308 Other specified dermatitis: Secondary | ICD-10-CM | POA: Diagnosis not present

## 2020-01-07 DIAGNOSIS — Z1211 Encounter for screening for malignant neoplasm of colon: Secondary | ICD-10-CM | POA: Diagnosis not present

## 2020-01-07 DIAGNOSIS — M899 Disorder of bone, unspecified: Secondary | ICD-10-CM | POA: Diagnosis not present

## 2020-01-07 DIAGNOSIS — N183 Chronic kidney disease, stage 3 unspecified: Secondary | ICD-10-CM | POA: Diagnosis not present

## 2020-01-07 DIAGNOSIS — Z1159 Encounter for screening for other viral diseases: Secondary | ICD-10-CM | POA: Diagnosis not present

## 2020-01-07 DIAGNOSIS — M810 Age-related osteoporosis without current pathological fracture: Secondary | ICD-10-CM | POA: Diagnosis not present

## 2020-01-07 DIAGNOSIS — Z79899 Other long term (current) drug therapy: Secondary | ICD-10-CM | POA: Diagnosis not present

## 2020-01-07 DIAGNOSIS — E785 Hyperlipidemia, unspecified: Secondary | ICD-10-CM | POA: Diagnosis not present

## 2020-01-07 DIAGNOSIS — I517 Cardiomegaly: Secondary | ICD-10-CM | POA: Diagnosis not present

## 2020-02-02 ENCOUNTER — Other Ambulatory Visit: Payer: Self-pay

## 2020-02-02 ENCOUNTER — Ambulatory Visit (INDEPENDENT_AMBULATORY_CARE_PROVIDER_SITE_OTHER): Payer: Medicare Other | Admitting: Cardiology

## 2020-02-02 ENCOUNTER — Encounter: Payer: Self-pay | Admitting: Cardiology

## 2020-02-02 VITALS — BP 180/100 | HR 89 | Ht 68.0 in | Wt 163.0 lb

## 2020-02-02 DIAGNOSIS — I340 Nonrheumatic mitral (valve) insufficiency: Secondary | ICD-10-CM

## 2020-02-02 DIAGNOSIS — R0600 Dyspnea, unspecified: Secondary | ICD-10-CM | POA: Diagnosis not present

## 2020-02-02 DIAGNOSIS — E7800 Pure hypercholesterolemia, unspecified: Secondary | ICD-10-CM | POA: Diagnosis not present

## 2020-02-02 DIAGNOSIS — R030 Elevated blood-pressure reading, without diagnosis of hypertension: Secondary | ICD-10-CM

## 2020-02-02 DIAGNOSIS — I517 Cardiomegaly: Secondary | ICD-10-CM

## 2020-02-02 DIAGNOSIS — R0609 Other forms of dyspnea: Secondary | ICD-10-CM

## 2020-02-02 NOTE — Patient Instructions (Signed)
Medication Instructions:   Your physician recommends that you continue on your current medications as directed. Please refer to the Current Medication list given to you today.  *If you need a refill on your cardiac medications before your next appointment, please call your pharmacy*   Lab Work: None Ordered If you have labs (blood work) drawn today and your tests are completely normal, you will receive your results only by: Marland Kitchen MyChart Message (if you have MyChart) OR . A paper copy in the mail If you have any lab test that is abnormal or we need to change your treatment, we will call you to review the results.   Testing/Procedures:  1. Your physician has requested that you have an echocardiogram. Echocardiography is a painless test that uses sound waves to create images of your heart. It provides your doctor with information about the size and shape of your heart and how well your heart's chambers and valves are working. This procedure takes approximately one hour. There are no restrictions for this procedure.  2.  Your physician has requested that you have a lexiscan myoview.    [place]  Your caregiver has ordered a Stress Test with nuclear imaging. The purpose of this test is to evaluate the blood supply to your heart muscle. This procedure is referred to as a "Non-Invasive Stress Test." This is because other than having an IV started in your vein, nothing is inserted or "invades" your body. Cardiac stress tests are done to find areas of poor blood flow to the heart by determining the extent of coronary artery disease (CAD). Some patients exercise on a treadmill, which naturally increases the blood flow to your heart, while others who are  unable to walk on a treadmill due to physical limitations have a pharmacologic/chemical stress agent called Lexiscan . This medicine will mimic walking on a treadmill by temporarily increasing your coronary blood flow.      PLEASE REPORT TO [facility]  MEDICAL MALL ENTRANCE  THE VOLUNTEERS AT THE FIRST DESK WILL DIRECT YOU WHERE TO GO  Date of Procedure:_____________________________________  Arrival Time for Procedure:______________________________  **Please note: these test may take anywhere between 2-4 hours to complete    PLEASE NOTIFY THE OFFICE AT LEAST 24 HOURS IN ADVANCE IF YOU ARE UNABLE TO [facility].  [phone] AND  PLEASE NOTIFY NUCLEAR MEDICINE AT [facility] AT LEAST 24 HOURS IN ADVANCE IF YOU ARE UNABLE TO KEEP YOUR APPOINTMENT. [phone]    How to prepare for your Myoview test:  1. Do not eat or drink after midnight 2. No caffeine for 24 hours prior to test 3. No smoking 24 hours prior to test. 4. Your medication may be taken with water.  If your doctor stopped a medication because of this test, do not take that medication. 5. Ladies, please do not wear dresses.  Skirts or pants are appropriate. Please wear a short sleeve shirt. 6. No perfume, cologne or lotion. 7. Wear comfortable walking shoes. No heels!     Follow-Up: At [facility], you and your health needs are our priority.  As part of our continuing mission to provide you with exceptional heart care, we have created designated Provider Care Teams.  These Care Teams include your primary Cardiologist (physician) and Advanced Practice Providers (APPs -  Physician Assistants and Nurse Practitioners) who all work together to provide you with the care you need, when you need it.  We recommend signing up for the patient portal called "MyChart".  Sign  up information is provided on this After Visit Summary.  MyChart is used to connect with patients for Virtual Visits (Telemedicine).  Patients are able to view lab/test results, encounter notes, upcoming appointments, etc.  Non-urgent messages can be sent to your provider as well.   To learn more about what you can do with MyChart, go to [email or web address].    Your next appointment:   Follow up  after Echo and Myoview   The format for your next appointment:   In Person  Provider:   Kate Sable, MD   Other Instructions   Echocardiogram An echocardiogram is a procedure that uses painless sound waves (ultrasound) to produce an image of the heart. Images from an echocardiogram can provide important information about:  Signs of coronary artery disease (CAD).  Aneurysm detection. An aneurysm is a weak or damaged part of an artery wall that bulges out from the normal force of blood pumping through the body.  Heart size and shape. Changes in the size or shape of the heart can be associated with certain conditions, including heart failure, aneurysm, and CAD.  Heart muscle function.  Heart valve function.  Signs of a past heart attack.  Fluid buildup around the heart.  Thickening of the heart muscle.  A tumor or infectious growth around the heart valves. Tell a health care provider about:  Any allergies you have.  All medicines you are taking, including vitamins, herbs, eye drops, creams, and over-the-counter medicines.  Any blood disorders you have.  Any surgeries you have had.  Any medical conditions you have.  Whether you are pregnant or may be pregnant. What are the risks? Generally, this is a safe procedure. However, problems may occur, including:  Allergic reaction to dye (contrast) that may be used during the procedure. What happens before the procedure? No specific preparation is needed. You may eat and drink normally. What happens during the procedure?   An IV tube may be inserted into one of your veins.  You may receive contrast through this tube. A contrast is an injection that improves the quality of the pictures from your heart.  A gel will be applied to your chest.  A wand-like tool (transducer) will be moved over your chest. The gel will help to transmit the sound waves from the transducer.  The sound waves will harmlessly bounce off  of your heart to allow the heart images to be captured in real-time motion. The images will be recorded on a computer. The procedure may vary among health care providers and hospitals. What happens after the procedure?  You may return to your normal, everyday life, including diet, activities, and medicines, unless your health care provider tells you not to do that. Summary  An echocardiogram is a procedure that uses painless sound waves (ultrasound) to produce an image of the heart.  Images from an echocardiogram can provide important information about the size and shape of your heart, heart muscle function, heart valve function, and fluid buildup around your heart.  You do not need to do anything to prepare before this procedure. You may eat and drink normally.  After the echocardiogram is completed, you may return to your normal, everyday life, unless your health care provider tells you not to do that. This information is not intended to replace advice given to you by your health care provider. Make sure you discuss any questions you have with your health care provider. Document Revised: 10/30/2018 Document Reviewed: 08/11/2016 Elsevier Patient Education  2020  [employer].

## 2020-02-02 NOTE — Progress Notes (Signed)
Cardiology Office Note:    Date:  02/02/2020   ID:  Kelly Keith, DOB [birth year], MRN [id number]  PCP:  Kelly Fess, MD  [facility] Cardiologist:  Kelly Sable, MD  [place] Electrophysiologist:  None   Referring MD: Kelly Fess, MD   Chief Complaint  Patient presents with  . New Patient (Initial Visit)    Establish care for LVH-Patient reports SOB; Meds verbally reviewed with patient.    History of Present Illness:    Kelly Keith is a 74 y.o. female with a hx of arthritis, GERD who presents due to LVH. Patient states having shortness of breath with exertion for years now.  She denies any chest pain or palpitations.  Denies any history of smoking.    Due to symptoms of shortness of breath, echo was obtained years ago. echocardiogram from 01/2017 showed normal systolic function, EF 01%, mild concentric LVH, wall thickness 1.2 cm. Mild to moderate MR, moderate TR.   she was recently diagnosed with hyperlipidemia, Crestor was recommended by primary care provider but patient would like to try nonstatin approach for 3 months first.  Also states her blood pressure is typically elevated when she comes to a physician's office.  Systolic blood pressure at home is usually 112.  Past Medical History:  Diagnosis Date  . Arthritis   . Cancer ([place]) 2016   left lower eye lid  . GERD (gastroesophageal reflux disease)    occas  . Motion sickness    cars  . Osteoporosis   . Shoulder pain, right   . Vertigo    in past    Past Surgical History:  Procedure Laterality Date  . ABDOMINAL HYSTERECTOMY    . AMPUTATION TOE Right 1980's   right 2nd toe due to infection  . BROW LIFT Bilateral 08/30/2015   Procedure: BLEPHAROPLASTY;  Surgeon: Karle Starch, MD;  Location: [place];  Service: Ophthalmology;  Laterality: Bilateral;  . ECTROPION REPAIR Right 08/30/2015   Procedure: REPAIR OF ECTROPION;  Surgeon: Karle Starch, MD;  Location: [place];  Service:  Ophthalmology;  Laterality: Right;  . EYE SURGERY    . FOOT SURGERY    . PTOSIS REPAIR Bilateral 08/30/2015   Procedure: PTOSIS REPAIR;  Surgeon: Karle Starch, MD;  Location: [place];  Service: Ophthalmology;  Laterality: Bilateral;  eyelids  . TONSILLECTOMY AND ADENOIDECTOMY      Current Medications: Current Meds  Medication Sig  . clobetasol cream (TEMOVATE) 7.51 % Apply 1 application topically 2 (two) times daily.  . Coenzyme Q10 (COQ10 PO) Take by mouth daily.  . fexofenadine (ALLEGRA) 180 MG tablet Take by mouth daily.   Marland Kitchen GARLIC PO Take by mouth daily.  . Ginger, Zingiber officinalis, (GINGER PO) Take by mouth daily.  Marland Kitchen ketoconazole (NIZORAL) 2 % cream Apply 1 application topically daily as needed.   . Lutein 6 MG CAPS Take by mouth.  . Milk Thistle Extract 87.5 MG CAPS Take by mouth.  . Multiple Vitamin (MULTIVITAMIN) tablet Take 1 tablet by mouth daily.  . Nutritional Supplements (CARDIO COMPLETE PO) Take by mouth daily.  Marland Kitchen OVER THE COUNTER MEDICATION daily. Joint advantage  . OVER THE COUNTER MEDICATION 2 (two) times daily. True osteo (bone health)  . Papaya CHEW Chew by mouth 2 (two) times daily.  . Probiotic Product (PROBIOTIC DAILY PO) Take by mouth daily.  . Turmeric POWD Take by mouth daily.      Allergies:   Septra [sulfamethoxazole-trimethoprim]  Social History   Socioeconomic History  . Marital status: Married    Spouse name: Not on file  . Number of children: Not on file  . Years of education: Not on file  . Highest education level: Not on file  Occupational History  . Not on file  Tobacco Use  . Smoking status: Never Smoker  . Smokeless tobacco: Never Used  Vaping Use  . Vaping Use: Never used  Substance and Sexual Activity  . Alcohol use: No    Alcohol/week: 0.0 standard drinks  . Drug use: Never  . Sexual activity: Not Currently    Comment: 1st intercourse- 66, married- 24 yrs   Other Topics Concern  . Not on file  Social History  Narrative   Married, has one daughter. Currently works as a Furniture conservator/restorer Strain:   . Difficulty of Paying Living Expenses:   Food Insecurity:   . Worried About Charity fundraiser in the Last Year:   . Arboriculturist in the Last Year:   Transportation Needs:   . Film/video editor (Medical):   Marland Kitchen Lack of Transportation (Non-Medical):   Physical Activity:   . Days of Exercise per Week:   . Minutes of Exercise per Session:   Stress:   . Feeling of Stress :   Social Connections:   . Frequency of Communication with Friends and Family:   . Frequency of Social Gatherings with Friends and Family:   . Attends Religious Services:   . Active Member of Clubs or Organizations:   . Attends Archivist Meetings:   Marland Kitchen Marital Status:      Family History: The patient's family history includes Anxiety disorder in her mother; Arthritis in her mother; Congestive Heart Failure in her father; Heart disease in her mother; High blood pressure in her father; Lung cancer in her father; Osteoporosis in her mother.  ROS:   Please see the history of present illness.     All other systems reviewed and are negative.  EKGs/Labs/Other Studies Reviewed:    The following studies were reviewed today:   EKG:  EKG is  ordered today.  The ekg ordered today demonstrates normal sinus rhythm  Recent Labs: No results found for requested labs within last 8760 hours.  Recent Lipid Panel No results found for: CHOL, TRIG, HDL, CHOLHDL, VLDL, LDLCALC, LDLDIRECT  Physical Exam:    VS:  BP (!) 180/100 (BP Location: Right Arm, Patient Position: Sitting, Cuff Size: Normal)   Pulse 89   Ht 5\' 8"  (1.727 m)   Wt 163 lb (73.9 kg)   SpO2 99%   BMI 24.78 kg/m     Wt Readings from Last 3 Encounters:  02/02/20 163 lb (73.9 kg)  08/21/19 160 lb (72.6 kg)  08/03/19 159 lb (72.1 kg)     GEN:  Well nourished, well developed in no acute  distress HEENT: Normal NECK: No JVD; No carotid bruits LYMPHATICS: No lymphadenopathy CARDIAC: RRR, no murmurs, rubs, gallops RESPIRATORY:  Clear to auscultation without rales, wheezing or rhonchi  ABDOMEN: Soft, non-tender, non-distended MUSCULOSKELETAL:  No edema; No deformity  SKIN: Warm and dry NEUROLOGIC:  Alert and oriented x 3 PSYCHIATRIC:  Normal affect   ASSESSMENT:    1. Dyspnea on exertion   2. Pure hypercholesterolemia   3. Mitral valve insufficiency, unspecified etiology   4. LVH (left ventricular hypertrophy)   5. Elevated BP without diagnosis of hypertension  PLAN:    In order of problems listed above:  1. Patient with worsening dyspnea on exertion.  Also history of hyperlipidemia.  Risk factors also include age.  Prior echocardiogram with mild LVH and mitral regurgitation.  Get repeat echocardiogram to evaluate severity of valvular disease or any cardiac dysfunction.  Get Lexiscan myocardial perfusion imaging stress test to evaluate ischemia. 2. Patient has history of hyperlipidemia.  Will do a trial of nonmedical therapy for 3 months to see if values improved.  If lipid panel stays abnormal, will recommend statin. 3. History of mild to moderate mitral regurgitation.  Echocardiogram assembled to evaluate valvular dysfunction progression. 4. History of mild LVH on echocardiogram.  Repeat echo as above. 5. Patient with elevated blood pressure without diagnosis of hypertension.  She may have whitecoat syndrome with systolic blood pressure at home around 112.  Advised to keep frequent BP log at home.  Monitor for now.  Follow-up after echocardiogram and stress test.  This note was generated in part or whole with voice recognition software. Voice recognition is usually quite accurate but there are transcription errors that can and very often do occur. I apologize for any typographical errors that were not detected and corrected.  Medication Adjustments/Labs and Tests  Ordered: Current medicines are reviewed at length with the patient today.  Concerns regarding medicines are outlined above.  Orders Placed This Encounter  Procedures  . NM Myocar Multi W/Spect W/Wall Motion / EF  . EKG 12-Lead  . ECHOCARDIOGRAM COMPLETE   No orders of the defined types were placed in this encounter.   Patient Instructions  Medication Instructions:   Your physician recommends that you continue on your current medications as directed. Please refer to the Current Medication list given to you today.  *If you need a refill on your cardiac medications before your next appointment, please call your pharmacy*   Lab Work: None Ordered If you have labs (blood work) drawn today and your tests are completely normal, you will receive your results only by: Marland Kitchen MyChart Message (if you have MyChart) OR . A paper copy in the mail If you have any lab test that is abnormal or we need to change your treatment, we will call you to review the results.   Testing/Procedures:  1. Your physician has requested that you have an echocardiogram. Echocardiography is a painless test that uses sound waves to create images of your heart. It provides your doctor with information about the size and shape of your heart and how well your heart's chambers and valves are working. This procedure takes approximately one hour. There are no restrictions for this procedure.  2.  Your physician has requested that you have a lexiscan myoview.    [place]  Your caregiver has ordered a Stress Test with nuclear imaging. The purpose of this test is to evaluate the blood supply to your heart muscle. This procedure is referred to as a "Non-Invasive Stress Test." This is because other than having an IV started in your vein, nothing is inserted or "invades" your body. Cardiac stress tests are done to find areas of poor blood flow to the heart by determining the extent of coronary artery disease (CAD). Some patients  exercise on a treadmill, which naturally increases the blood flow to your heart, while others who are  unable to walk on a treadmill due to physical limitations have a pharmacologic/chemical stress agent called Lexiscan . This medicine will mimic walking on a treadmill by temporarily increasing  your coronary blood flow.      PLEASE REPORT TO [facility] ENTRANCE  THE VOLUNTEERS AT THE FIRST DESK WILL DIRECT YOU WHERE TO GO  Date of Procedure:_____________________________________  Arrival Time for Procedure:______________________________  **Please note: these test may take anywhere between 2-4 hours to complete    PLEASE NOTIFY THE OFFICE AT LEAST 24 HOURS IN ADVANCE IF YOU ARE UNABLE TO [place].  [phone] AND  PLEASE NOTIFY NUCLEAR MEDICINE AT [facility] AT LEAST 24 HOURS IN ADVANCE IF YOU ARE UNABLE TO KEEP YOUR APPOINTMENT. [phone]    How to prepare for your Myoview test:  1. Do not eat or drink after midnight 2. No caffeine for 24 hours prior to test 3. No smoking 24 hours prior to test. 4. Your medication may be taken with water.  If your doctor stopped a medication because of this test, do not take that medication. 5. Ladies, please do not wear dresses.  Skirts or pants are appropriate. Please wear a short sleeve shirt. 6. No perfume, cologne or lotion. 7. Wear comfortable walking shoes. No heels!     Follow-Up: At [facility], you and your health needs are our priority.  As part of our continuing mission to provide you with exceptional heart care, we have created designated Provider Care Teams.  These Care Teams include your primary Cardiologist (physician) and Advanced Practice Providers (APPs -  Physician Assistants and Nurse Practitioners) who all work together to provide you with the care you need, when you need it.  We recommend signing up for the patient portal called "MyChart".  Sign up information is provided on this After Visit Summary.   MyChart is used to connect with patients for Virtual Visits (Telemedicine).  Patients are able to view lab/test results, encounter notes, upcoming appointments, etc.  Non-urgent messages can be sent to your provider as well.   To learn more about what you can do with MyChart, go to [email or web address].    Your next appointment:   Follow up after Echo and Myoview   The format for your next appointment:   In Person  Provider:   Kate Sable, MD   Other Instructions   Echocardiogram An echocardiogram is a procedure that uses painless sound waves (ultrasound) to produce an image of the heart. Images from an echocardiogram can provide important information about:  Signs of coronary artery disease (CAD).  Aneurysm detection. An aneurysm is a weak or damaged part of an artery wall that bulges out from the normal force of blood pumping through the body.  Heart size and shape. Changes in the size or shape of the heart can be associated with certain conditions, including heart failure, aneurysm, and CAD.  Heart muscle function.  Heart valve function.  Signs of a past heart attack.  Fluid buildup around the heart.  Thickening of the heart muscle.  A tumor or infectious growth around the heart valves. Tell a health care provider about:  Any allergies you have.  All medicines you are taking, including vitamins, herbs, eye drops, creams, and over-the-counter medicines.  Any blood disorders you have.  Any surgeries you have had.  Any medical conditions you have.  Whether you are pregnant or may be pregnant. What are the risks? Generally, this is a safe procedure. However, problems may occur, including:  Allergic reaction to dye (contrast) that may be used during the procedure. What happens before the procedure? No specific preparation is needed. You may eat and drink normally. What  happens during the procedure?   An IV tube may be inserted into one of your  veins.  You may receive contrast through this tube. A contrast is an injection that improves the quality of the pictures from your heart.  A gel will be applied to your chest.  A wand-like tool (transducer) will be moved over your chest. The gel will help to transmit the sound waves from the transducer.  The sound waves will harmlessly bounce off of your heart to allow the heart images to be captured in real-time motion. The images will be recorded on a computer. The procedure may vary among health care providers and hospitals. What happens after the procedure?  You may return to your normal, everyday life, including diet, activities, and medicines, unless your health care provider tells you not to do that. Summary  An echocardiogram is a procedure that uses painless sound waves (ultrasound) to produce an image of the heart.  Images from an echocardiogram can provide important information about the size and shape of your heart, heart muscle function, heart valve function, and fluid buildup around your heart.  You do not need to do anything to prepare before this procedure. You may eat and drink normally.  After the echocardiogram is completed, you may return to your normal, everyday life, unless your health care provider tells you not to do that. This information is not intended to replace advice given to you by your health care provider. Make sure you discuss any questions you have with your health care provider. Document Revised: 10/30/2018 Document Reviewed: 08/11/2016 Elsevier Patient Education  2020 Perry, Kelly Sable, MD  02/02/2020 10:42 AM    [place]

## 2020-02-10 DIAGNOSIS — L570 Actinic keratosis: Secondary | ICD-10-CM | POA: Diagnosis not present

## 2020-02-10 DIAGNOSIS — X32XXXD Exposure to sunlight, subsequent encounter: Secondary | ICD-10-CM | POA: Diagnosis not present

## 2020-02-16 ENCOUNTER — Telehealth: Payer: Self-pay | Admitting: Cardiology

## 2020-02-16 NOTE — Telephone Encounter (Signed)
Patient called back, states she found out what she needed from the nuclear medicine technician, and states she did not need a call back.

## 2020-02-16 NOTE — Telephone Encounter (Signed)
Patient calling  Has questions regarding stress test that she has tomorrow 02/17/20 Please call to discuss

## 2020-02-17 ENCOUNTER — Ambulatory Visit
Admission: RE | Admit: 2020-02-17 | Discharge: 2020-02-17 | Disposition: A | Discharge status: Home / Self Care | Payer: Medicare Other | Source: Ambulatory Visit | Attending: Cardiology | Admitting: Cardiology

## 2020-02-17 ENCOUNTER — Other Ambulatory Visit: Payer: Self-pay

## 2020-02-17 DIAGNOSIS — R0609 Other forms of dyspnea: Secondary | ICD-10-CM

## 2020-02-17 DIAGNOSIS — R0600 Dyspnea, unspecified: Secondary | ICD-10-CM

## 2020-02-17 LAB — NM MYOCAR MULTI W/SPECT W/WALL MOTION / EF
Estimated workload: 1 METS
Exercise duration (min): 0 min
Exercise duration (sec): 0 s
LV dias vol: 58 mL (ref 46–106)
LV sys vol: 30 mL
MPHR: 148 {beats}/min
Peak HR: 99 {beats}/min
Percent HR: 66 %
Rest HR: 61 {beats}/min
SDS: 0
SRS: 0
SSS: 1
TID: 0.91

## 2020-02-17 MED ORDER — TECHNETIUM TC 99M TETROFOSMIN IV KIT
30.0000 | PACK | Freq: Once | INTRAVENOUS | Status: AC | PRN
  Administered 2020-02-17: 30.682 via INTRAVENOUS

## 2020-02-17 MED ORDER — TECHNETIUM TC 99M TETROFOSMIN IV KIT
10.8500 | PACK | Freq: Once | INTRAVENOUS | Status: AC | PRN
  Administered 2020-02-17: 10.85 via INTRAVENOUS

## 2020-02-17 MED ORDER — REGADENOSON 0.4 MG/5ML IV SOLN
0.4000 mg | Freq: Once | INTRAVENOUS | Status: AC
  Administered 2020-02-17: 0.4 mg via INTRAVENOUS
  Filled 2020-02-17: qty 5

## 2020-03-03 ENCOUNTER — Other Ambulatory Visit: Payer: Self-pay

## 2020-03-03 ENCOUNTER — Ambulatory Visit (INDEPENDENT_AMBULATORY_CARE_PROVIDER_SITE_OTHER): Payer: Medicare Other

## 2020-03-03 DIAGNOSIS — R0609 Other forms of dyspnea: Secondary | ICD-10-CM

## 2020-03-03 DIAGNOSIS — R0600 Dyspnea, unspecified: Secondary | ICD-10-CM

## 2020-03-04 LAB — ECHOCARDIOGRAM COMPLETE
AR max vel: 2.43 cm2
AV Area VTI: 2.47 cm2
AV Area mean vel: 2.46 cm2
AV Mean grad: 3 mmHg
AV Peak grad: 7.8 mmHg
Ao pk vel: 1.4 m/s
Area-P 1/2: 3.13 cm2
Calc EF: 67.4 %
S' Lateral: 2.2 cm
Single Plane A2C EF: 68.4 %
Single Plane A4C EF: 66.2 %

## 2020-03-09 DIAGNOSIS — Z20822 Contact with and (suspected) exposure to covid-19: Secondary | ICD-10-CM | POA: Diagnosis not present

## 2020-03-18 ENCOUNTER — Encounter: Payer: Self-pay | Admitting: Cardiology

## 2020-03-18 ENCOUNTER — Ambulatory Visit (INDEPENDENT_AMBULATORY_CARE_PROVIDER_SITE_OTHER): Payer: Medicare Other | Admitting: Cardiology

## 2020-03-18 ENCOUNTER — Other Ambulatory Visit: Payer: Self-pay

## 2020-03-18 VITALS — BP 148/80 | HR 70 | Ht 68.0 in | Wt 164.2 lb

## 2020-03-18 DIAGNOSIS — Z8719 Personal history of other diseases of the digestive system: Secondary | ICD-10-CM | POA: Diagnosis not present

## 2020-03-18 DIAGNOSIS — E7800 Pure hypercholesterolemia, unspecified: Secondary | ICD-10-CM

## 2020-03-18 DIAGNOSIS — R030 Elevated blood-pressure reading, without diagnosis of hypertension: Secondary | ICD-10-CM | POA: Diagnosis not present

## 2020-03-18 DIAGNOSIS — R0600 Dyspnea, unspecified: Secondary | ICD-10-CM | POA: Diagnosis not present

## 2020-03-18 DIAGNOSIS — R0609 Other forms of dyspnea: Secondary | ICD-10-CM

## 2020-03-18 NOTE — Patient Instructions (Signed)
Medication Instructions:  Your physician recommends that you continue on your current medications as directed. Please refer to the Current Medication list given to you today. *If you need a refill on your cardiac medications before your next appointment, please call your pharmacy*   Lab Work: None Ordered If you have labs (blood work) drawn today and your tests are completely normal, you will receive your results only by: Marland Kitchen MyChart Message (if you have MyChart) OR . A paper copy in the mail If you have any lab test that is abnormal or we need to change your treatment, we will call you to review the results.   Testing/Procedures: None Ordered   Follow-Up: At [facility], you and your health needs are our priority.  As part of our continuing mission to provide you with exceptional heart care, we have created designated Provider Care Teams.  These Care Teams include your primary Cardiologist (physician) and Advanced Practice Providers (APPs -  Physician Assistants and Nurse Practitioners) who all work together to provide you with the care you need, when you need it.  We recommend signing up for the patient portal called "MyChart".  Sign up information is provided on this After Visit Summary.  MyChart is used to connect with patients for Virtual Visits (Telemedicine).  Patients are able to view lab/test results, encounter notes, upcoming appointments, etc.  Non-urgent messages can be sent to your provider as well.   To learn more about what you can do with MyChart, go to [email or web address].    Your next appointment:   Follow up as needed   The format for your next appointment:   In Person  Provider:   Kate Sable, MD   Other Instructions  [email or web address]

## 2020-03-18 NOTE — Progress Notes (Signed)
Cardiology Office Note:    Date:  03/18/2020   ID:  Kelly, Keith 1945/08/29, MRN [id number]  PCP:  Hulan Fess, MD  [facility] Cardiologist:  Kate Sable, MD  [place] Electrophysiologist:  None   Referring MD: Hulan Fess, MD   Chief Complaint  Patient presents with  . Follow-up    Follow up for test results. Medications verbally reviewed with patient.     History of Present Illness:    Kelly Keith is a 74 y.o. female with a hx of arthritis, GERD, hyperlipidemia who presents for follow-up.  She was last seen due to shortness of breath on exertion and also history of mitral regurgitation.  Echocardiogram was ordered to evaluate cardiac function and also valvular function.  Lexiscan Myoview was ordered to evaluate any ischemia.  Has a history of hiatal hernia, follows up in [place].  Will like a local gastroenterologist as more convenient and closer to home for patient.  Prior notes Due to symptoms of shortness of breath, echo was obtained years ago. echocardiogram from 01/2017 showed normal systolic function, EF 46%, mild concentric LVH, wall thickness 1.2 cm. Mild to moderate MR, moderate TR.   she was recently diagnosed with hyperlipidemia, Crestor was recommended by primary care provider but patient would like to try nonstatin approach for 3 months first.  Also states her blood pressure is typically elevated when she comes to a physician's office.  Systolic blood pressure at home is usually 112.  Past Medical History:  Diagnosis Date  . Arthritis   . Cancer ([place]) 2016   left lower eye lid  . GERD (gastroesophageal reflux disease)    occas  . Motion sickness    cars  . Osteoporosis   . Shoulder pain, right   . Vertigo    in past    Past Surgical History:  Procedure Laterality Date  . ABDOMINAL HYSTERECTOMY    . AMPUTATION TOE Right 1980's   right 2nd toe due to infection  . BROW LIFT Bilateral 08/30/2015   Procedure: BLEPHAROPLASTY;   Surgeon: Karle Starch, MD;  Location: [facility];  Service: Ophthalmology;  Laterality: Bilateral;  . ECTROPION REPAIR Right 08/30/2015   Procedure: REPAIR OF ECTROPION;  Surgeon: Karle Starch, MD;  Location: [place];  Service: Ophthalmology;  Laterality: Right;  . EYE SURGERY    . FOOT SURGERY    . PTOSIS REPAIR Bilateral 08/30/2015   Procedure: PTOSIS REPAIR;  Surgeon: Karle Starch, MD;  Location: [facility];  Service: Ophthalmology;  Laterality: Bilateral;  eyelids  . TONSILLECTOMY AND ADENOIDECTOMY      Current Medications: Current Meds  Medication Sig  . Citrus Bergamot 250 MG/0.25GM POWD 500 mg.   . clobetasol cream (TEMOVATE) 2.70 % Apply 1 application topically 2 (two) times daily.  . Coenzyme Q10 (COQ10 PO) Take by mouth daily.  . fexofenadine (ALLEGRA) 180 MG tablet Take by mouth daily.   Marland Kitchen GARLIC PO Take by mouth daily.  . Ginger, Zingiber officinalis, (GINGER PO) Take by mouth daily.  Marland Kitchen ketoconazole (NIZORAL) 2 % cream Apply 1 application topically daily as needed.   . Lutein 6 MG CAPS Take by mouth.  . Milk Thistle Extract 87.5 MG CAPS Take by mouth.  . Multiple Vitamin (MULTIVITAMIN) tablet Take 1 tablet by mouth daily.  . Nutritional Supplements (CARDIO COMPLETE PO) Take by mouth daily.  Marland Kitchen OVER THE COUNTER MEDICATION daily. Joint advantage  . OVER THE COUNTER MEDICATION 2 (two)  times daily. True osteo (bone health)  . Papaya CHEW Chew by mouth 2 (two) times daily.  . Probiotic Product (PROBIOTIC DAILY PO) Take by mouth daily.  . Turmeric POWD Take by mouth daily.      Allergies:   Septra [sulfamethoxazole-trimethoprim]   Social History   Socioeconomic History  . Marital status: Married    Spouse name: Not on file  . Number of children: Not on file  . Years of education: Not on file  . Highest education level: Not on file  Occupational History  . Not on file  Tobacco Use  . Smoking status: Never Smoker  . Smokeless tobacco: Never  Used  Vaping Use  . Vaping Use: Never used  Substance and Sexual Activity  . Alcohol use: No    Alcohol/week: 0.0 standard drinks  . Drug use: Never  . Sexual activity: Not Currently    Comment: 1st intercourse- 33, married- 39 yrs   Other Topics Concern  . Not on file  Social History Narrative   Married, has one daughter. Currently works as a Furniture conservator/restorer Strain:   . Difficulty of Paying Living Expenses: Not on file  Food Insecurity:   . Worried About Charity fundraiser in the Last Year: Not on file  . Ran Out of Food in the Last Year: Not on file  Transportation Needs:   . Lack of Transportation (Medical): Not on file  . Lack of Transportation (Non-Medical): Not on file  Physical Activity:   . Days of Exercise per Week: Not on file  . Minutes of Exercise per Session: Not on file  Stress:   . Feeling of Stress : Not on file  Social Connections:   . Frequency of Communication with Friends and Family: Not on file  . Frequency of Social Gatherings with Friends and Family: Not on file  . Attends Religious Services: Not on file  . Active Member of Clubs or Organizations: Not on file  . Attends Archivist Meetings: Not on file  . Marital Status: Not on file     Family History: The patient's family history includes Anxiety disorder in her mother; Arthritis in her mother; Congestive Heart Failure in her father; Heart disease in her mother; High blood pressure in her father; Lung cancer in her father; Osteoporosis in her mother.  ROS:   Please see the history of present illness.     All other systems reviewed and are negative.  EKGs/Labs/Other Studies Reviewed:    The following studies were reviewed today:   EKG:  EKG is  ordered today.  The ekg ordered today demonstrates normal sinus rhythm  Recent Labs: No results found for requested labs within last 8760 hours.  Recent Lipid Panel No results found  for: CHOL, TRIG, HDL, CHOLHDL, VLDL, LDLCALC, LDLDIRECT  Physical Exam:    VS:  BP (!) 148/80 (BP Location: Left Arm, Patient Position: Sitting, Cuff Size: Normal)   Pulse 70   Ht 5\' 8"  (1.727 m)   Wt 164 lb 3.2 oz (74.5 kg)   SpO2 98%   BMI 24.97 kg/m     Wt Readings from Last 3 Encounters:  03/18/20 164 lb 3.2 oz (74.5 kg)  02/02/20 163 lb (73.9 kg)  08/21/19 160 lb (72.6 kg)     GEN:  Well nourished, well developed in no acute distress HEENT: Normal NECK: No JVD; No carotid bruits LYMPHATICS: No lymphadenopathy CARDIAC: RRR,  no murmurs, rubs, gallops RESPIRATORY:  Clear to auscultation without rales, wheezing or rhonchi  ABDOMEN: Soft, non-tender, non-distended MUSCULOSKELETAL:  No edema; No deformity  SKIN: Warm and dry NEUROLOGIC:  Alert and oriented x 3 PSYCHIATRIC:  Normal affect   ASSESSMENT:    1. Dyspnea on exertion   2. Pure hypercholesterolemia   3. Elevated BP without diagnosis of hypertension   4. Hx of hiatal hernia    PLAN:    In order of problems listed above:  1. Patient with worsening dyspnea on exertion. Risk factors also include age, hld.  Echocardiogram showed normal systolic function, EF 60 to 65%, impaired relaxation, mild MR.  Mild AI.  Lexiscan Myoview with no evidence for ischemia, low risk study.  LV wall thickness was normal.  Patient reassured. 2. Patient has history of hyperlipidemia.  Patient would like to try nonmedical therapy.  Plan for repeat fasting lipid profile end of next month with pcp.  If elevated, recommend statin. 3. Patient with elevated blood pressure without diagnosis of hypertension.  She likely has whitecoat syndrome. 4. Patient has a history of hiatal hernia, will like a local gastroenterologist to evaluate her.  Will give information for our local GI group.  If any GI procedure is needed, it is okay from a cardiac perspective for that to be done.  She has normal systolic function, normal stress testing.  Follow-up as  needed.  Total encounter time 40 minutes  Greater than 50% was spent in counseling and coordination of care with the patient   This note was generated in part or whole with voice recognition software. Voice recognition is usually quite accurate but there are transcription errors that can and very often do occur. I apologize for any typographical errors that were not detected and corrected.  Medication Adjustments/Labs and Tests Ordered: Current medicines are reviewed at length with the patient today.  Concerns regarding medicines are outlined above.  Orders Placed This Encounter  Procedures  . EKG 12-Lead   No orders of the defined types were placed in this encounter.   Patient Instructions  Medication Instructions:  Your physician recommends that you continue on your current medications as directed. Please refer to the Current Medication list given to you today. *If you need a refill on your cardiac medications before your next appointment, please call your pharmacy*   Lab Work: None Ordered If you have labs (blood work) drawn today and your tests are completely normal, you will receive your results only by: Marland Kitchen MyChart Message (if you have MyChart) OR . A paper copy in the mail If you have any lab test that is abnormal or we need to change your treatment, we will call you to review the results.   Testing/Procedures: None Ordered   Follow-Up: At [facility], you and your health needs are our priority.  As part of our continuing mission to provide you with exceptional heart care, we have created designated Provider Care Teams.  These Care Teams include your primary Cardiologist (physician) and Advanced Practice Providers (APPs -  Physician Assistants and Nurse Practitioners) who all work together to provide you with the care you need, when you need it.  We recommend signing up for the patient portal called "MyChart".  Sign up information is provided on this After Visit Summary.   MyChart is used to connect with patients for Virtual Visits (Telemedicine).  Patients are able to view lab/test results, encounter notes, upcoming appointments, etc.  Non-urgent messages can be sent to  your provider as well.   To learn more about what you can do with MyChart, go to [email or web address].    Your next appointment:   Follow up as needed   The format for your next appointment:   In Person  Provider:   Kate Sable, MD   Other Instructions  [email or web address]     Signed, Kate Sable, MD  03/18/2020 11:56 AM    [place]

## 2020-03-24 DIAGNOSIS — L03012 Cellulitis of left finger: Secondary | ICD-10-CM | POA: Diagnosis not present

## 2020-03-24 DIAGNOSIS — L570 Actinic keratosis: Secondary | ICD-10-CM | POA: Diagnosis not present

## 2020-03-24 DIAGNOSIS — X32XXXD Exposure to sunlight, subsequent encounter: Secondary | ICD-10-CM | POA: Diagnosis not present

## 2020-03-29 DIAGNOSIS — G8929 Other chronic pain: Secondary | ICD-10-CM | POA: Diagnosis not present

## 2020-03-29 DIAGNOSIS — M25532 Pain in left wrist: Secondary | ICD-10-CM | POA: Diagnosis not present

## 2020-03-29 DIAGNOSIS — M546 Pain in thoracic spine: Secondary | ICD-10-CM | POA: Diagnosis not present

## 2020-03-29 DIAGNOSIS — M778 Other enthesopathies, not elsewhere classified: Secondary | ICD-10-CM | POA: Diagnosis not present

## 2020-04-05 DIAGNOSIS — Z23 Encounter for immunization: Secondary | ICD-10-CM | POA: Diagnosis not present

## 2020-04-15 DIAGNOSIS — M546 Pain in thoracic spine: Secondary | ICD-10-CM | POA: Diagnosis not present

## 2020-04-15 DIAGNOSIS — G8929 Other chronic pain: Secondary | ICD-10-CM | POA: Diagnosis not present

## 2020-04-18 DIAGNOSIS — G8929 Other chronic pain: Secondary | ICD-10-CM | POA: Diagnosis not present

## 2020-04-18 DIAGNOSIS — M546 Pain in thoracic spine: Secondary | ICD-10-CM | POA: Diagnosis not present

## 2020-04-19 DIAGNOSIS — X32XXXD Exposure to sunlight, subsequent encounter: Secondary | ICD-10-CM | POA: Diagnosis not present

## 2020-04-19 DIAGNOSIS — L570 Actinic keratosis: Secondary | ICD-10-CM | POA: Diagnosis not present

## 2020-04-19 DIAGNOSIS — C44329 Squamous cell carcinoma of skin of other parts of face: Secondary | ICD-10-CM | POA: Diagnosis not present

## 2020-04-20 DIAGNOSIS — M546 Pain in thoracic spine: Secondary | ICD-10-CM | POA: Diagnosis not present

## 2020-04-20 DIAGNOSIS — G8929 Other chronic pain: Secondary | ICD-10-CM | POA: Diagnosis not present

## 2020-04-21 DIAGNOSIS — Z1211 Encounter for screening for malignant neoplasm of colon: Secondary | ICD-10-CM | POA: Diagnosis not present

## 2020-04-21 DIAGNOSIS — Z79899 Other long term (current) drug therapy: Secondary | ICD-10-CM | POA: Diagnosis not present

## 2020-04-21 DIAGNOSIS — E785 Hyperlipidemia, unspecified: Secondary | ICD-10-CM | POA: Diagnosis not present

## 2020-04-25 DIAGNOSIS — G8929 Other chronic pain: Secondary | ICD-10-CM | POA: Diagnosis not present

## 2020-04-25 DIAGNOSIS — M546 Pain in thoracic spine: Secondary | ICD-10-CM | POA: Diagnosis not present

## 2020-04-26 DIAGNOSIS — Z1211 Encounter for screening for malignant neoplasm of colon: Secondary | ICD-10-CM | POA: Diagnosis not present

## 2020-04-28 DIAGNOSIS — M546 Pain in thoracic spine: Secondary | ICD-10-CM | POA: Diagnosis not present

## 2020-04-28 DIAGNOSIS — G8929 Other chronic pain: Secondary | ICD-10-CM | POA: Diagnosis not present

## 2020-05-02 DIAGNOSIS — G8929 Other chronic pain: Secondary | ICD-10-CM | POA: Diagnosis not present

## 2020-05-02 DIAGNOSIS — M546 Pain in thoracic spine: Secondary | ICD-10-CM | POA: Diagnosis not present

## 2020-05-06 ENCOUNTER — Other Ambulatory Visit: Payer: Self-pay

## 2020-05-06 ENCOUNTER — Ambulatory Visit (INDEPENDENT_AMBULATORY_CARE_PROVIDER_SITE_OTHER): Payer: Medicare Other | Admitting: Podiatry

## 2020-05-06 DIAGNOSIS — M546 Pain in thoracic spine: Secondary | ICD-10-CM | POA: Diagnosis not present

## 2020-05-06 DIAGNOSIS — G8929 Other chronic pain: Secondary | ICD-10-CM | POA: Diagnosis not present

## 2020-05-06 DIAGNOSIS — M722 Plantar fascial fibromatosis: Secondary | ICD-10-CM

## 2020-05-06 DIAGNOSIS — M779 Enthesopathy, unspecified: Secondary | ICD-10-CM

## 2020-05-06 NOTE — Progress Notes (Signed)
Subjective:   Patient ID: Kelly Keith, female   DOB: 74 y.o.   MRN: [id number]   HPI Patient presents with pain on the right heel and also the right ankle and states both of them are bothering her currently   ROS      Objective:  Physical Exam  Neurovascular status intact with discomfort of the sinus tarsi right with inflammation deep into the joint and also discomfort in the medial band of the plantar fascia     Assessment:  Sinus tarsitis capsulitis along with plantar fasciitis right     Plan:  H&P reviewed conditions and for the sinus tarsi I went ahead did sterile prep and injected the sinus tarsi 3 mg Kenalog 5 mg Xylocaine and for the plantar fascia I did sterile prep and injected the fascial band 3 mg dexamethasone 5 mg Xylocaine and reappoint for [place] to recheck again as needed

## 2020-05-10 DIAGNOSIS — M546 Pain in thoracic spine: Secondary | ICD-10-CM | POA: Diagnosis not present

## 2020-05-10 DIAGNOSIS — G8929 Other chronic pain: Secondary | ICD-10-CM | POA: Diagnosis not present

## 2020-05-12 DIAGNOSIS — M47812 Spondylosis without myelopathy or radiculopathy, cervical region: Secondary | ICD-10-CM | POA: Diagnosis not present

## 2020-05-12 DIAGNOSIS — H539 Unspecified visual disturbance: Secondary | ICD-10-CM | POA: Diagnosis not present

## 2020-05-12 DIAGNOSIS — L219 Seborrheic dermatitis, unspecified: Secondary | ICD-10-CM | POA: Diagnosis not present

## 2020-05-12 DIAGNOSIS — R2681 Unsteadiness on feet: Secondary | ICD-10-CM | POA: Diagnosis not present

## 2020-05-12 DIAGNOSIS — H9319 Tinnitus, unspecified ear: Secondary | ICD-10-CM | POA: Diagnosis not present

## 2020-05-16 DIAGNOSIS — H353131 Nonexudative age-related macular degeneration, bilateral, early dry stage: Secondary | ICD-10-CM | POA: Diagnosis not present

## 2020-05-17 DIAGNOSIS — M25532 Pain in left wrist: Secondary | ICD-10-CM | POA: Diagnosis not present

## 2020-05-17 DIAGNOSIS — M546 Pain in thoracic spine: Secondary | ICD-10-CM | POA: Diagnosis not present

## 2020-05-17 DIAGNOSIS — G8929 Other chronic pain: Secondary | ICD-10-CM | POA: Diagnosis not present

## 2020-05-18 ENCOUNTER — Other Ambulatory Visit (HOSPITAL_COMMUNITY): Payer: Self-pay | Admitting: Orthopedic Surgery

## 2020-05-18 ENCOUNTER — Other Ambulatory Visit: Payer: Self-pay | Admitting: Orthopedic Surgery

## 2020-05-18 DIAGNOSIS — G8929 Other chronic pain: Secondary | ICD-10-CM

## 2020-05-18 DIAGNOSIS — C44329 Squamous cell carcinoma of skin of other parts of face: Secondary | ICD-10-CM | POA: Diagnosis not present

## 2020-05-24 DIAGNOSIS — M25532 Pain in left wrist: Secondary | ICD-10-CM | POA: Diagnosis not present

## 2020-05-27 DIAGNOSIS — M25532 Pain in left wrist: Secondary | ICD-10-CM | POA: Diagnosis not present

## 2020-06-03 DIAGNOSIS — M25532 Pain in left wrist: Secondary | ICD-10-CM | POA: Diagnosis not present

## 2020-06-13 ENCOUNTER — Ambulatory Visit
Admission: RE | Admit: 2020-06-13 | Discharge: 2020-06-13 | Disposition: A | Discharge status: Home / Self Care | Payer: Medicare Other | Source: Ambulatory Visit | Attending: Orthopedic Surgery | Admitting: Orthopedic Surgery

## 2020-06-13 ENCOUNTER — Other Ambulatory Visit: Payer: Self-pay

## 2020-06-13 DIAGNOSIS — M47814 Spondylosis without myelopathy or radiculopathy, thoracic region: Secondary | ICD-10-CM | POA: Diagnosis not present

## 2020-06-13 DIAGNOSIS — G9589 Other specified diseases of spinal cord: Secondary | ICD-10-CM | POA: Diagnosis not present

## 2020-06-13 DIAGNOSIS — M5125 Other intervertebral disc displacement, thoracolumbar region: Secondary | ICD-10-CM | POA: Diagnosis not present

## 2020-06-13 DIAGNOSIS — G8929 Other chronic pain: Secondary | ICD-10-CM | POA: Diagnosis not present

## 2020-06-13 DIAGNOSIS — M40204 Unspecified kyphosis, thoracic region: Secondary | ICD-10-CM | POA: Diagnosis not present

## 2020-06-13 DIAGNOSIS — M546 Pain in thoracic spine: Secondary | ICD-10-CM | POA: Insufficient documentation

## 2020-06-14 ENCOUNTER — Other Ambulatory Visit: Payer: Self-pay | Admitting: Internal Medicine

## 2020-06-14 ENCOUNTER — Ambulatory Visit: Payer: Medicare Other

## 2020-06-14 NOTE — Progress Notes (Signed)
° °  Covid-19 Vaccination Clinic  Name:  Kelly Keith    MRN: [id number] DOB: 1945-12-07  06/14/2020  Kelly Keith was observed post Covid-19 immunization for 15 minutes without incident. She was provided with Vaccine Information Sheet and instruction to access the V-Safe system.   Kelly Keith was instructed to call 911 with any severe reactions post vaccine:  Difficulty breathing   Swelling of face and throat   A fast heartbeat   A bad rash all over body   Dizziness and weakness

## 2020-06-20 DIAGNOSIS — L728 Other follicular cysts of the skin and subcutaneous tissue: Secondary | ICD-10-CM | POA: Diagnosis not present

## 2020-06-20 DIAGNOSIS — C44329 Squamous cell carcinoma of skin of other parts of face: Secondary | ICD-10-CM | POA: Diagnosis not present

## 2020-06-24 DIAGNOSIS — M25532 Pain in left wrist: Secondary | ICD-10-CM | POA: Diagnosis not present

## 2020-07-07 DIAGNOSIS — M25532 Pain in left wrist: Secondary | ICD-10-CM | POA: Diagnosis not present

## 2020-07-21 DIAGNOSIS — R300 Dysuria: Secondary | ICD-10-CM | POA: Diagnosis not present

## 2020-07-21 DIAGNOSIS — N390 Urinary tract infection, site not specified: Secondary | ICD-10-CM | POA: Diagnosis not present

## 2020-07-25 ENCOUNTER — Ambulatory Visit: Payer: Medicare Other | Admitting: Occupational Therapy

## 2020-07-28 ENCOUNTER — Encounter: Payer: Self-pay | Admitting: Occupational Therapy

## 2020-07-28 ENCOUNTER — Ambulatory Visit: Payer: Medicare Other | Attending: Orthopedic Surgery | Admitting: Occupational Therapy

## 2020-07-28 ENCOUNTER — Other Ambulatory Visit: Payer: Self-pay

## 2020-07-28 DIAGNOSIS — M25532 Pain in left wrist: Secondary | ICD-10-CM

## 2020-07-28 DIAGNOSIS — M25632 Stiffness of left wrist, not elsewhere classified: Secondary | ICD-10-CM

## 2020-07-28 NOTE — Therapy (Signed)
[facility] [address], [place], [address] Phone: [phone]   Fax:  [phone]  Occupational Therapy Evaluation  Patient Details  Name: Kelly Keith MRN: [id number] Date of Birth: 1946/07/19 Referring Provider (OT): Baldwin Jamaica   Encounter Date: 07/28/2020   OT End of Session - 07/28/20 1731    Visit Number 1    Number of Visits 8    Date for OT Re-Evaluation 09/08/20    OT Start Time 1450    OT Stop Time 1540    OT Time Calculation (min) 50 min    Activity Tolerance Patient tolerated treatment well    Behavior During Therapy [facility] for tasks assessed/performed           Past Medical History:  Diagnosis Date  . Arthritis   . Cancer ([facility]) 2016   left lower eye lid  . GERD (gastroesophageal reflux disease)    occas  . Motion sickness    cars  . Osteoporosis   . Shoulder pain, right   . Vertigo    in past    Past Surgical History:  Procedure Laterality Date  . ABDOMINAL HYSTERECTOMY    . AMPUTATION TOE Right 1980's   right 2nd toe due to infection  . BROW LIFT Bilateral 08/30/2015   Procedure: BLEPHAROPLASTY;  Surgeon: Imagene Riches, MD;  [facility];  Service: Ophthalmology;  Laterality: Bilateral;  . ECTROPION REPAIR Right 08/30/2015   Procedure: REPAIR OF ECTROPION;  Surgeon: Imagene Riches, MD;  Location: [facility];  Service: Ophthalmology;  Laterality: Right;  . EYE SURGERY    . FOOT SURGERY    . PTOSIS REPAIR Bilateral 08/30/2015   Procedure: PTOSIS REPAIR;  Surgeon: Imagene Riches, MD;  [facility];  Service: Ophthalmology;  Laterality: Bilateral;  eyelids  . TONSILLECTOMY AND ADENOIDECTOMY      There were no vitals filed for this visit.   Subjective Assessment - 07/28/20 1714    Subjective  My wrist pain started about July or August last year -and then had some sessions with PT for wrist when she was treating my back - but not better - pain is  less but still there with use - certain things bother it more- but I still do it    Pertinent History Pain started in July or Aug -when she did something in kitching and felt sharp pain - it got better thru the months - had few session with PT - but pain not better- still linger about 3/10 in wrist - more dorsal and radial - was refer to OT/hand therapy    Patient Stated Goals Want the pain better in my L wrist so I can use it to push up , twist and turn things, pull or squeeze    Currently in Pain? Yes    Pain Score 3     Pain Location Wrist    Pain Orientation Left    Pain Descriptors / Indicators Aching;Tender;Tightness;Stabbing    Pain Type Chronic pain;Acute pain    Pain Onset More than a month ago    Pain Frequency Intermittent             [facility] OT Assessment - 07/28/20 0001      Assessment   Medical Diagnosis L wrist extensor tendinits    Referring Provider (OT) Baldwin Jamaica    Onset Date/Surgical Date 01/21/20    Hand Dominance Right    Prior  Therapy PT few sessions when seen for thoracic issues      Home  Environment   Lives With Spouse      Prior Function   Vocation Retired    Leisure church pianist, cook some, on phone, read, watch tv      AROM   Right Wrist Extension 70 Degrees    Right Wrist Flexion 90 Degrees    Right Wrist Radial Deviation 20 Degrees    Right Wrist Ulnar Deviation 30 Degrees    Left Wrist Extension 70 Degrees   strain 3/10   Left Wrist Flexion 90 Degrees   pull 3/10   Left Wrist Radial Deviation 15 Degrees    Left Wrist Ulnar Deviation 27 Degrees      Strength   Right Hand Grip (lbs) 62    Right Hand Lateral Pinch 12 lbs    Right Hand 3 Point Pinch 10 lbs    Left Hand Grip (lbs) 55   pain 3/10   Left Hand Lateral Pinch 12 lbs   pain 2-3/10   Left Hand 3 Point Pinch 10 lbs   pain radial wrist /thumb 3/10            FLuidotherapy - AROM for wrist in all planes- pain free  showed increase AROM with less pain   HEP review and hand out  provided:  COntrast - 2-3 x day or shower AROM for wrist in all planes -pain free -but do wrist ext, flexion loose fist - not open hand  10 reps  Wear prefab wrist splint most all the time during day  If no pain night time - not have to wear               OT Education - 07/28/20 1731    Education Details findings of eval and HEP    Person(s) Educated Patient    Methods Explanation;Demonstration;Tactile cues;Verbal cues;Handout    Comprehension Verbal cues required;Returned demonstration;Verbalized understanding               OT Long Term Goals - 07/28/20 1755      OT LONG TERM GOAL #1   Title L wrist AROM increase to WNL with no increase symptoms    Baseline pain or strain for wrist flexion, extention 3/10 - RD /UD stiff - slight pull    Time 3    Period Weeks    Status New    Target Date 08/18/20      OT LONG TERM GOAL #2   Title L grip and prehension strength to be WNL without symptoms to grip, twist , squeez and pull objects    Baseline pain 3/10 with grip , prehension -    Time 6    Period Weeks    Status New    Target Date 09/08/20      OT LONG TERM GOAL #3   Title Pain improve for pt to wean out of splints be able to grip , pull , pushup    Baseline Need splint and or compression -pain 3/10 or more without any splint    Time 4    Period Weeks    Status New    Target Date 08/25/20                 Plan - 07/28/20 1732    Clinical Impression Statement Pt present at OT eval with L wrist pain - had it since Aug 21 - pain is better - but still lingers  3/10 with ADL's and IADL's - wrist AROM and grip/prehension strength- tender at TFCC , and over radial side of wrist - with AROM over dorsal wrist- negative for DeQuervains, CMC arthritis grinding test negative - but ? tendinitis of L wrist extensors - pt to wear wrist prefab splint to decrease pain with use of L hand - do heat and AROM pain free range until next session    OT Occupational Profile and  History Problem Focused Assessment - Including review of records relating to presenting problem    Occupational performance deficits (Please refer to evaluation for details): ADL's;IADL's;Play;Leisure;Social Participation    Body Structure / Function / Physical Skills ADL;IADL;Pain;Strength;Edema;Flexibility;ROM;UE functional use    Rehab Potential Fair    Clinical Decision Making Limited treatment options, no task modification necessary    Comorbidities Affecting Occupational Performance: None    Modification or Assistance to Complete Evaluation  No modification of tasks or assist necessary to complete eval    OT Frequency --   decrease to 1 x wk later depend in progress   OT Duration 6 weeks    OT Treatment/Interventions Self-care/ADL training;Ultrasound;Contrast Bath;Fluidtherapy;Paraffin;Iontophoresis;Therapeutic exercise;Manual Therapy;Splinting;Patient/family education    Consulted and Agree with Plan of Care Patient           Patient will benefit from skilled therapeutic intervention in order to improve the following deficits and impairments:   Body Structure / Function / Physical Skills: ADL,IADL,Pain,Strength,Edema,Flexibility,ROM,UE functional use       Visit Diagnosis: Pain in left wrist - Plan: Ot plan of care cert/re-cert  Stiffness of left wrist, not elsewhere classified - Plan: Ot plan of care cert/re-cert    Problem List Patient Active Problem List   Diagnosis Date Noted  . Dyspnea on exertion 03/07/2017  . Chest pain 03/07/2017  . Rotator cuff tendinitis, right 05/20/2015  . Spondylosis of cervical region without myelopathy or radiculopathy 05/20/2015  . Rotator cuff impingement syndrome of right shoulder 02/05/2014    Rosalyn Gess  OTR/L,CLT 07/28/2020, 6:00 PM  [place] [facility] [address] [place], [address] Phone: [phone]   Fax:  [phone]  Name: ALECTRA GELLMAN MRN:  [id number] Date of Birth: 1946-07-08

## 2020-07-28 NOTE — Patient Instructions (Signed)
COntrast - 2-3 x day or shower AROM for wrist in all planes -pain free -but do wrist ext, flexion loose fist - not open hand  10 reps  Wear prefab wrist splint most all the time during day  If no pain night time - not have to wear

## 2020-08-02 ENCOUNTER — Other Ambulatory Visit: Payer: Self-pay

## 2020-08-02 ENCOUNTER — Ambulatory Visit: Payer: Medicare Other | Admitting: Occupational Therapy

## 2020-08-02 DIAGNOSIS — M25532 Pain in left wrist: Secondary | ICD-10-CM | POA: Diagnosis not present

## 2020-08-02 DIAGNOSIS — M25632 Stiffness of left wrist, not elsewhere classified: Secondary | ICD-10-CM

## 2020-08-02 NOTE — Therapy (Signed)
[facility] [address] [place], [address] Phone: [phone]   Fax:  [phone]  Occupational Therapy Treatment  Patient Details  Name: Kelly Keith MRN: [id number] Date of Birth: [birth year] Referring Provider (OT): Cassell Smiles   Encounter Date: 08/02/2020   OT End of Session - 08/02/20 1433    Visit Number 2    Number of Visits 8    Date for OT Re-Evaluation 09/08/20    OT Start Time 1346    OT Stop Time 1427    OT Time Calculation (min) 41 min    Activity Tolerance Patient tolerated treatment well    Behavior During Therapy [facility] for tasks assessed/performed           Past Medical History:  Diagnosis Date  . Arthritis   . Cancer ([place]) 2016   left lower eye lid  . GERD (gastroesophageal reflux disease)    occas  . Motion sickness    cars  . Osteoporosis   . Shoulder pain, right   . Vertigo    in past    Past Surgical History:  Procedure Laterality Date  . ABDOMINAL HYSTERECTOMY    . AMPUTATION TOE Right 1980's   right 2nd toe due to infection  . BROW LIFT Bilateral 08/30/2015   Procedure: BLEPHAROPLASTY;  Surgeon: Karle Starch, MD;  Location: [place];  Service: Ophthalmology;  Laterality: Bilateral;  . ECTROPION REPAIR Right 08/30/2015   Procedure: REPAIR OF ECTROPION;  Surgeon: Karle Starch, MD;  Location: [place];  Service: Ophthalmology;  Laterality: Right;  . EYE SURGERY    . FOOT SURGERY    . PTOSIS REPAIR Bilateral 08/30/2015   Procedure: PTOSIS REPAIR;  Surgeon: Karle Starch, MD;  Location: [place];  Service: Ophthalmology;  Laterality: Bilateral;  eyelids  . TONSILLECTOMY AND ADENOIDECTOMY      There were no vitals filed for this visit.   Subjective Assessment - 08/02/20 1431    Subjective  This is the splint I got - it wore it - but it do allow me to move in it- did sleep with it since I seen you - still some pain when using my hand like in  shower - certain movements    Pertinent History Pain started in July or Aug -when she did something in kitching and felt sharp pain - it got better thru the months - had few session with PT - but pain not better- still linger about 3/10 in wrist - more dorsal and radial - was refer to OT/hand therapy    Patient Stated Goals Want the pain better in my L wrist so I can use it to push up , twist and turn things, pull or squeeze    Currently in Pain? Yes    Pain Score 3     Pain Location Wrist    Pain Orientation Left    Pain Descriptors / Indicators Aching;Tender;Tightness    Pain Onset More than a month ago    Pain Frequency Intermittent           Pt arrive with prefab wrist splint - she had at home- but not immobilizing her  Fitted with new prefab wrist splint to wear as much as she can - off for HEP and ADL's only  Wrist AROM cont to be close to WNL - but pain about same 3/10 and tender over TFCC - 3/10 Grip and prehension strength same -  and pain 3/10              OT Treatments/Exercises (OP) - 08/02/20 0001      Ultrasound   Ultrasound Location dorsal wrist- over extensors    Ultrasound Parameters 3.3MHZ ,20%, 0.8 intensity    Ultrasound Goals Edema;Pain      LUE Fluidotherapy   Number Minutes Fluidotherapy 8 Minutes    LUE Fluidotherapy Location Wrist;Hand    Comments AROM pain free range for wrist in all planes             FLuidotherapy - AROM for wrist in all planes- pain free Graston tool nr 2 done on volar and dorsal wrist - as well as CT and MC spreads and thumb webspace soft tissue by OT prior to [place]eves for wrist in all planes -pain free by OT 10 reps  If no pain night time - not have to wear  US done at end of session      OT Education - 08/02/20 1433    Education Details splint wearing and HEP    Person(s) Educated Patient    Methods Explanation;Demonstration;Tactile cues;Verbal cues;Handout    Comprehension Verbal cues  required;Returned demonstration;Verbalized understanding               OT Long Term Goals - 07/28/20 1755      OT LONG TERM GOAL #1   Title L wrist AROM increase to WNL with no increase symptoms    Baseline pain or strain for wrist flexion, extention 3/10 - RD /UD stiff - slight pull    Time 3    Period Weeks    Status New    Target Date 08/18/20      OT LONG TERM GOAL #2   Title L grip and prehension strength to be WNL without symptoms to grip, twist , squeez and pull objects    Baseline pain 3/10 with grip , prehension -    Time 6    Period Weeks    Status New    Target Date 09/08/20      OT LONG TERM GOAL #3   Title Pain improve for pt to wean out of splints be able to grip , pull , pushup    Baseline Need splint and or compression -pain 3/10 or more without any splint    Time 4    Period Weeks    Status New    Target Date 08/25/20                 Plan - 08/02/20 1434    Clinical Impression Statement Pt show AROM for L wrist WNL but cont to have pull or strain of 3/10 at dorsal wrist , tender over ulnar wrist - some edema - grip and prehension same and 3/10 pain - pt wrist splint not immobilize enough - fitted with another prefab splint that fist better- pt to use most all the time except with HEP to decrease pain    OT Occupational Profile and History Problem Focused Assessment - Including review of records relating to presenting problem    Occupational performance deficits (Please refer to evaluation for details): ADL's;IADL's;Play;Leisure;Social Participation    Body Structure / Function / Physical Skills ADL;IADL;Pain;Strength;Edema;Flexibility;ROM;UE functional use    Rehab Potential Fair    Clinical Decision Making Limited treatment options, no task modification necessary    Comorbidities Affecting Occupational Performance: None    Modification or Assistance to Complete Evaluation  No  modification of tasks or assist necessary to complete eval    OT  Frequency 2x / week    OT Duration 6 weeks    OT Treatment/Interventions Self-care/ADL training;Ultrasound;Contrast Bath;Fluidtherapy;Paraffin;Iontophoresis;Therapeutic exercise;Manual Therapy;Splinting;Patient/family education    Consulted and Agree with Plan of Care Patient           Patient will benefit from skilled therapeutic intervention in order to improve the following deficits and impairments:   Body Structure / Function / Physical Skills: ADL,IADL,Pain,Strength,Edema,Flexibility,ROM,UE functional use       Visit Diagnosis: Stiffness of left wrist, not elsewhere classified    Problem List Patient Active Problem List   Diagnosis Date Noted  . Dyspnea on exertion 03/07/2017  . Chest pain 03/07/2017  . Rotator cuff tendinitis, right 05/20/2015  . Spondylosis of cervical region without myelopathy or radiculopathy 05/20/2015  . Rotator cuff impingement syndrome of right shoulder 02/05/2014    Rosalyn Gess 08/02/2020, 3:32 PM  [facility] [address], [place], [address] Phone: [phone]   Fax:  [phone]  Name: Kelly Keith MRN: [id number] Date of Birth: 02/28/1946

## 2020-08-05 ENCOUNTER — Ambulatory Visit: Payer: Medicare Other | Admitting: Occupational Therapy

## 2020-08-05 ENCOUNTER — Other Ambulatory Visit: Payer: Self-pay

## 2020-08-05 DIAGNOSIS — M25632 Stiffness of left wrist, not elsewhere classified: Secondary | ICD-10-CM

## 2020-08-05 DIAGNOSIS — M25532 Pain in left wrist: Secondary | ICD-10-CM | POA: Diagnosis not present

## 2020-08-05 NOTE — Therapy (Signed)
[facility] [address] [place], [address] Phone: [phone]   Fax:  [phone]  Occupational Therapy Treatment  Patient Details  Name: Kelly Keith MRN: [id number] Date of Birth: 1945-12-11 Referring Provider (OT): Cassell Smiles   Encounter Date: 08/05/2020   OT End of Session - 08/05/20 1243    Visit Number 3    Number of Visits 8    Date for OT Re-Evaluation 09/08/20    OT Start Time 1119    OT Stop Time 1202    OT Time Calculation (min) 43 min    Activity Tolerance Patient tolerated treatment well    Behavior During Therapy [facility] for tasks assessed/performed           Past Medical History:  Diagnosis Date  . Arthritis   . Cancer ([place]) 2016   left lower eye lid  . GERD (gastroesophageal reflux disease)    occas  . Motion sickness    cars  . Osteoporosis   . Shoulder pain, right   . Vertigo    in past    Past Surgical History:  Procedure Laterality Date  . ABDOMINAL HYSTERECTOMY    . AMPUTATION TOE Right 1980's   right 2nd toe due to infection  . BROW LIFT Bilateral 08/30/2015   Procedure: BLEPHAROPLASTY;  Surgeon: Karle Starch, MD;  Location: [place];  Service: Ophthalmology;  Laterality: Bilateral;  . ECTROPION REPAIR Right 08/30/2015   Procedure: REPAIR OF ECTROPION;  Surgeon: Karle Starch, MD;  Location: [place];  Service: Ophthalmology;  Laterality: Right;  . EYE SURGERY    . FOOT SURGERY    . PTOSIS REPAIR Bilateral 08/30/2015   Procedure: PTOSIS REPAIR;  Surgeon: Karle Starch, MD;  Location: [place];  Service: Ophthalmology;  Laterality: Bilateral;  eyelids  . TONSILLECTOMY AND ADENOIDECTOMY      There were no vitals filed for this visit.   Subjective Assessment - 08/05/20 1157    Subjective  I wore the splint most all the time- pain or strain about same when I take if off to use it    Pertinent History Pain started in July or Aug -when she did  something in kitching and felt sharp pain - it got better thru the months - had few session with PT - but pain not better- still linger about 3/10 in wrist - more dorsal and radial - was refer to OT/hand therapy    Patient Stated Goals Want the pain better in my L wrist so I can use it to push up , twist and turn things, pull or squeeze    Currently in Pain? Yes    Pain Location Wrist    Pain Orientation Left    Pain Descriptors / Indicators Aching;Tightness;Tender    Pain Type Chronic pain;Acute pain    Pain Onset More than a month ago    Pain Frequency Intermittent                 Pt arrive with prefab wrist splint on fitted by OT last time - wearing as much as she can - off for HEP and ADL's only  Wrist AROM cont to be close to WNL - but pain about same 3/10  In end range flexion, ext , supination and tender over TFCC - 3/10 Grip  - pain 3/10  About same       Pt ed on ionto use - and  what to expect- skin check done and prior - pt had tiny blister - but should disappear in 24 hrs    OT Treatments/Exercises (OP) - 08/05/20 0001      Iontophoresis   Type of Iontophoresis Dexamethasone    Location ulnar wrist , TFCC    Dose med patch, 1.6 current    Time 22 min                     OT Education - 08/05/20 1243    Education Details splint wearing and HEP, ionto use    Person(s) Educated Patient    Methods Explanation;Demonstration;Tactile cues;Verbal cues;Handout    Comprehension Verbal cues required;Returned demonstration;Verbalized understanding               OT Long Term Goals - 07/28/20 1755      OT LONG TERM GOAL #1   Title L wrist AROM increase to WNL with no increase symptoms    Baseline pain or strain for wrist flexion, extention 3/10 - RD /UD stiff - slight pull    Time 3    Period Weeks    Status New    Target Date 08/18/20      OT LONG TERM GOAL #2   Title L grip and prehension strength to be WNL without symptoms to grip, twist ,  squeez and pull objects    Baseline pain 3/10 with grip , prehension -    Time 6    Period Weeks    Status New    Target Date 09/08/20      OT LONG TERM GOAL #3   Title Pain improve for pt to wean out of splints be able to grip , pull , pushup    Baseline Need splint and or compression -pain 3/10 or more without any splint    Time 4    Period Weeks    Status New    Target Date 08/25/20                 Plan - 08/05/20 1244    Clinical Impression Statement Pt AROM for wrist extention and flexoin 80 and 90 degrees but pain still same - over dorsal wrist and then tenderness over TFCC- pt wearing wrist prefab splint most all the time- did add this date Ionto with dexamethazone on ulnar wrist - pt tolerate well at 1.6 current - tiny blister but should disappear in 24 hrs    OT Occupational Profile and History Problem Focused Assessment - Including review of records relating to presenting problem    Occupational performance deficits (Please refer to evaluation for details): ADL's;IADL's;Play;Leisure;Social Participation    Body Structure / Function / Physical Skills ADL;IADL;Pain;Strength;Edema;Flexibility;ROM;UE functional use    Rehab Potential Fair    Clinical Decision Making Limited treatment options, no task modification necessary    Comorbidities Affecting Occupational Performance: None    Modification or Assistance to Complete Evaluation  No modification of tasks or assist necessary to complete eval    OT Frequency 2x / week    OT Duration 6 weeks    OT Treatment/Interventions Self-care/ADL training;Ultrasound;Contrast Bath;Fluidtherapy;Paraffin;Iontophoresis;Therapeutic exercise;Manual Therapy;Splinting;Patient/family education    Consulted and Agree with Plan of Care Patient           Patient will benefit from skilled therapeutic intervention in order to improve the following deficits and impairments:   Body Structure / Function / Physical Skills:  ADL,IADL,Pain,Strength,Edema,Flexibility,ROM,UE functional use       Visit Diagnosis: Stiffness  of left wrist, not elsewhere classified  Pain in left wrist    Problem List Patient Active Problem List   Diagnosis Date Noted  . Dyspnea on exertion 03/07/2017  . Chest pain 03/07/2017  . Rotator cuff tendinitis, right 05/20/2015  . Spondylosis of cervical region without myelopathy or radiculopathy 05/20/2015  . Rotator cuff impingement syndrome of right shoulder 02/05/2014    Rosalyn Gess OTR/L,CLT  08/05/2020, 12:46 PM  [facility] [address], [place], [address] Phone: [phone]   Fax:  [phone]  Name: Kelly Keith MRN: [id number] Date of Birth: 1946/02/08

## 2020-08-09 ENCOUNTER — Other Ambulatory Visit: Payer: Self-pay

## 2020-08-09 ENCOUNTER — Ambulatory Visit: Payer: Medicare Other | Admitting: Occupational Therapy

## 2020-08-09 DIAGNOSIS — M25632 Stiffness of left wrist, not elsewhere classified: Secondary | ICD-10-CM | POA: Diagnosis not present

## 2020-08-09 DIAGNOSIS — M25532 Pain in left wrist: Secondary | ICD-10-CM | POA: Diagnosis not present

## 2020-08-09 NOTE — Therapy (Signed)
[facility] [address], [place], [address] Phone: [phone]   Fax:  [phone]  Occupational Therapy Treatment  Patient Details  Name: ARELLA BLINDER MRN: [id number] Date of Birth: May 28, 1946 Referring Provider (OT): Cassell Smiles   Encounter Date: 08/09/2020   OT End of Session - 08/09/20 1547    Visit Number 4    Number of Visits 8    Date for OT Re-Evaluation 09/08/20    OT Start Time 1515    OT Stop Time 1557    OT Time Calculation (min) 42 min    Activity Tolerance Patient tolerated treatment well    Behavior During Therapy [facility] for tasks assessed/performed           Past Medical History:  Diagnosis Date  . Arthritis   . Cancer ([place]) 2016   left lower eye lid  . GERD (gastroesophageal reflux disease)    occas  . Motion sickness    cars  . Osteoporosis   . Shoulder pain, right   . Vertigo    in past    Past Surgical History:  Procedure Laterality Date  . ABDOMINAL HYSTERECTOMY    . AMPUTATION TOE Right 1980's   right 2nd toe due to infection  . BROW LIFT Bilateral 08/30/2015   Procedure: BLEPHAROPLASTY;  Surgeon: Karle Starch, MD;  Location: [employer];  Service: Ophthalmology;  Laterality: Bilateral;  . ECTROPION REPAIR Right 08/30/2015   Procedure: REPAIR OF ECTROPION;  Surgeon: Karle Starch, MD;  Location: [place];  Service: Ophthalmology;  Laterality: Right;  . EYE SURGERY    . FOOT SURGERY    . PTOSIS REPAIR Bilateral 08/30/2015   Procedure: PTOSIS REPAIR;  Surgeon: Karle Starch, MD;  Location: [place];  Service: Ophthalmology;  Laterality: Bilateral;  eyelids  . TONSILLECTOMY AND ADENOIDECTOMY      There were no vitals filed for this visit.   Subjective Assessment - 08/09/20 1545    Subjective  DId okay - wearing my splint and pain about 3/10  still    Pertinent History Pain started in July or Aug -when she did something in kitching and felt sharp  pain - it got better thru the months - had few session with PT - but pain not better- still linger about 3/10 in wrist - more dorsal and radial - was refer to OT/hand therapy    Patient Stated Goals Want the pain better in my L wrist so I can use it to push up , twist and turn things, pull or squeeze    Currently in Pain? Yes    Pain Score 3     Pain Location Wrist    Pain Orientation Left    Pain Descriptors / Indicators Aching;Tightness    Pain Type Chronic pain;Acute pain    Pain Onset More than a month ago    Pain Frequency Intermittent              Pt arrive with prefab wrist splint on fitted by OT last week - wearing as much as she can - off for HEP and ADL's only  Wrist AROM cont to be close to WNL - but pain about same 3/10  In end range flexion, ext , supination and tender over TFCC ulnar wrist - 3/10 Grip not painful today    Pt ed on ionto use again - last time had tiny blister on ulnar wrist- this date  done ionto on dorsal wrist where pt feels pain with AROM -  skin check done cont to have tiny red spot on ulnar wrist- ed on what to expect again - 1.4 tolerate and then was able to increase to 1.6 later - pt to keep patch on for hour this time           OT Treatments/Exercises (OP) - 08/09/20 0001      Iontophoresis   Type of Iontophoresis Dexamethasone    Location dorsal wrist , TFCC    Dose med patch, 1.4 increase to 1.6 current    Time 24                  OT Education - 08/09/20 1547    Education Details splint wearing and HEP, ionto use    Person(s) Educated Patient    Methods Explanation;Demonstration;Tactile cues;Verbal cues;Handout    Comprehension Verbal cues required;Returned demonstration;Verbalized understanding               OT Long Term Goals - 07/28/20 1755      OT LONG TERM GOAL #1   Title L wrist AROM increase to WNL with no increase symptoms    Baseline pain or strain for wrist flexion, extention 3/10 - RD /UD stiff -  slight pull    Time 3    Period Weeks    Status New    Target Date 08/18/20      OT LONG TERM GOAL #2   Title L grip and prehension strength to be WNL without symptoms to grip, twist , squeez and pull objects    Baseline pain 3/10 with grip , prehension -    Time 6    Period Weeks    Status New    Target Date 09/08/20      OT LONG TERM GOAL #3   Title Pain improve for pt to wean out of splints be able to grip , pull , pushup    Baseline Need splint and or compression -pain 3/10 or more without any splint    Time 4    Period Weeks    Status New    Target Date 08/25/20                 Plan - 08/09/20 1547    Clinical Impression Statement Pt cont to have some pain mostly over dorsal wrist with wrist ext and flexion - end range - and tenderness over ulnar wrist - 3/10 - today 2nd ionto session - pt to cont with wrist splint , contrast and pain free AROM    OT Occupational Profile and History Problem Focused Assessment - Including review of records relating to presenting problem    Occupational performance deficits (Please refer to evaluation for details): ADL's;IADL's;Play;Leisure;Social Participation    Body Structure / Function / Physical Skills ADL;IADL;Pain;Strength;Edema;Flexibility;ROM;UE functional use    Rehab Potential Fair    Clinical Decision Making Limited treatment options, no task modification necessary    Comorbidities Affecting Occupational Performance: None    Modification or Assistance to Complete Evaluation  No modification of tasks or assist necessary to complete eval    OT Frequency 2x / week    OT Duration 6 weeks    OT Treatment/Interventions Self-care/ADL training;Ultrasound;Contrast Bath;Fluidtherapy;Paraffin;Iontophoresis;Therapeutic exercise;Manual Therapy;Splinting;Patient/family education    Consulted and Agree with Plan of Care Patient           Patient will benefit from skilled therapeutic intervention in order to improve the following  deficits and impairments:   Body Structure / Function / Physical Skills: ADL,IADL,Pain,Strength,Edema,Flexibility,ROM,UE functional use       Visit Diagnosis: Stiffness of left wrist, not elsewhere classified  Pain in left wrist    Problem List Patient Active Problem List   Diagnosis Date Noted  . Dyspnea on exertion 03/07/2017  . Chest pain 03/07/2017  . Rotator cuff tendinitis, right 05/20/2015  . Spondylosis of cervical region without myelopathy or radiculopathy 05/20/2015  . Rotator cuff impingement syndrome of right shoulder 02/05/2014    Rosalyn Gess  OTR/L,CLT 08/09/2020, 3:50 PM  [facility] [address], [place], [address] Phone: [phone]   Fax:  [phone]  Name: DARNICE COMRIE MRN: [id number] Date of Birth: 1945/09/03

## 2020-08-10 DIAGNOSIS — E785 Hyperlipidemia, unspecified: Secondary | ICD-10-CM | POA: Diagnosis not present

## 2020-08-11 ENCOUNTER — Other Ambulatory Visit: Payer: Self-pay

## 2020-08-11 ENCOUNTER — Ambulatory Visit: Payer: Medicare Other | Admitting: Occupational Therapy

## 2020-08-11 DIAGNOSIS — M25632 Stiffness of left wrist, not elsewhere classified: Secondary | ICD-10-CM

## 2020-08-11 DIAGNOSIS — M25532 Pain in left wrist: Secondary | ICD-10-CM | POA: Diagnosis not present

## 2020-08-11 NOTE — Therapy (Signed)
[place] [facility] [address], [place], [address] Phone: [phone]   Fax:  [phone]  Occupational Therapy Treatment  Patient Details  Name: Kelly Keith MRN: [id number] Date of Birth: 1945-08-23 Referring Provider (OT): Cassell Smiles   Encounter Date: 08/11/2020   OT End of Session - 08/11/20 1701    Visit Number 5    Number of Visits 8    Date for OT Re-Evaluation 09/08/20    OT Start Time 1400    OT Stop Time 1436    OT Time Calculation (min) 36 min    Activity Tolerance Patient tolerated treatment well    Behavior During Therapy [facility] for tasks assessed/performed           Past Medical History:  Diagnosis Date  . Arthritis   . Cancer ([place]) 2016   left lower eye lid  . GERD (gastroesophageal reflux disease)    occas  . Motion sickness    cars  . Osteoporosis   . Shoulder pain, right   . Vertigo    in past    Past Surgical History:  Procedure Laterality Date  . ABDOMINAL HYSTERECTOMY    . AMPUTATION TOE Right 1980's   right 2nd toe due to infection  . BROW LIFT Bilateral 08/30/2015   Procedure: BLEPHAROPLASTY;  Surgeon: Karle Starch, MD;  Location: [place];  Service: Ophthalmology;  Laterality: Bilateral;  . ECTROPION REPAIR Right 08/30/2015   Procedure: REPAIR OF ECTROPION;  Surgeon: Karle Starch, MD;  Location: [place];  Service: Ophthalmology;  Laterality: Right;  . EYE SURGERY    . FOOT SURGERY    . PTOSIS REPAIR Bilateral 08/30/2015   Procedure: PTOSIS REPAIR;  Surgeon: Karle Starch, MD;  Location: [place];  Service: Ophthalmology;  Laterality: Bilateral;  eyelids  . TONSILLECTOMY AND ADENOIDECTOMY      There were no vitals filed for this visit.   Subjective Assessment - 08/11/20 1659    Subjective  I think my wrist is little better - did not put lotion on today    Pertinent History Pain started in July or Aug -when she did something in kitching and  felt sharp pain - it got better thru the months - had few session with PT - but pain not better- still linger about 3/10 in wrist - more dorsal and radial - was refer to OT/hand therapy    Patient Stated Goals Want the pain better in my L wrist so I can use it to push up , twist and turn things, pull or squeeze    Currently in Pain? Yes    Pain Score 2     Pain Location Wrist    Pain Orientation Left    Pain Descriptors / Indicators Aching;Tightness    Pain Type Acute pain;Chronic pain    Pain Onset More than a month ago    Pain Frequency Intermittent                  Pt arrive with prefab wrist splinton fitted by OT last week - wearingas much as she can - off for HEP and ADL's only  Wrist AROM cont to be close to WNL - and this date pt report pain little better- decrease to 2/10 - tenderness over TFCC ulnar wrist -2- 3/10 Grip not painful today   Pt ed on ionto use again -had last week tiny blister on ulnar wrist- last time  and today done ionto on dorsal wrist where pt feels pain with AROM - ed on what to expect again - 1.4 current tolerate  - pt to keep patch on for hour this time -         OT Treatments/Exercises (OP) - 08/11/20 0001      Iontophoresis   Type of Iontophoresis Dexamethasone    Location dorsal wrist , TFCC    Dose med patch, 1.4 current    Time 29                  OT Education - 08/11/20 1701    Education Details progress, ionto use and splint wearing    Person(s) Educated Patient    Methods Explanation;Demonstration;Tactile cues;Verbal cues;Handout    Comprehension Verbal cues required;Returned demonstration;Verbalized understanding               OT Long Term Goals - 07/28/20 1755      OT LONG TERM GOAL #1   Title L wrist AROM increase to WNL with no increase symptoms    Baseline pain or strain for wrist flexion, extention 3/10 - RD /UD stiff - slight pull    Time 3    Period Weeks    Status New    Target Date 08/18/20       OT LONG TERM GOAL #2   Title L grip and prehension strength to be WNL without symptoms to grip, twist , squeez and pull objects    Baseline pain 3/10 with grip , prehension -    Time 6    Period Weeks    Status New    Target Date 09/08/20      OT LONG TERM GOAL #3   Title Pain improve for pt to wean out of splints be able to grip , pull , pushup    Baseline Need splint and or compression -pain 3/10 or more without any splint    Time 4    Period Weeks    Status New    Target Date 08/25/20                 Plan - 08/11/20 1701    Clinical Impression Statement Pt report after 2nd session of ionto earlier this week -wrist feels little better- less pain - pain with AROM this date 2/10 instead of 3/10 - done 3rd session this date - pt able tolerate about 1.4 current today - pt to cont with splint wearing, contrast and pain free AROM    OT Occupational Profile and History Problem Focused Assessment - Including review of records relating to presenting problem    Occupational performance deficits (Please refer to evaluation for details): ADL's;IADL's;Play;Leisure;Social Participation    Body Structure / Function / Physical Skills ADL;IADL;Pain;Strength;Edema;Flexibility;ROM;UE functional use    Rehab Potential Fair    Clinical Decision Making Limited treatment options, no task modification necessary    Comorbidities Affecting Occupational Performance: None    Modification or Assistance to Complete Evaluation  No modification of tasks or assist necessary to complete eval    OT Frequency 2x / week    OT Duration 6 weeks    OT Treatment/Interventions Self-care/ADL training;Ultrasound;Contrast Bath;Fluidtherapy;Paraffin;Iontophoresis;Therapeutic exercise;Manual Therapy;Splinting;Patient/family education    Consulted and Agree with Plan of Care Patient           Patient will benefit from skilled therapeutic intervention in order to improve the following deficits and impairments:    Body Structure / Function / Physical Skills: ADL,IADL,Pain,Strength,Edema,Flexibility,ROM,UE functional  use       Visit Diagnosis: Stiffness of left wrist, not elsewhere classified  Pain in left wrist    Problem List Patient Active Problem List   Diagnosis Date Noted  . Dyspnea on exertion 03/07/2017  . Chest pain 03/07/2017  . Rotator cuff tendinitis, right 05/20/2015  . Spondylosis of cervical region without myelopathy or radiculopathy 05/20/2015  . Rotator cuff impingement syndrome of right shoulder 02/05/2014    Rosalyn Gess OTR/L,CLT 08/11/2020, 5:04 PM  [place] [address], [place], [address] Phone: [phone]   Fax:  [phone]  Name: SHARAYAH CONNERS MRN: [id number] Date of Birth: 1946-03-01

## 2020-08-15 DIAGNOSIS — Z1231 Encounter for screening mammogram for malignant neoplasm of breast: Secondary | ICD-10-CM | POA: Diagnosis not present

## 2020-08-16 ENCOUNTER — Ambulatory Visit: Payer: Medicare Other | Admitting: Occupational Therapy

## 2020-08-16 ENCOUNTER — Other Ambulatory Visit: Payer: Self-pay

## 2020-08-16 DIAGNOSIS — M25632 Stiffness of left wrist, not elsewhere classified: Secondary | ICD-10-CM

## 2020-08-16 DIAGNOSIS — M25532 Pain in left wrist: Secondary | ICD-10-CM | POA: Diagnosis not present

## 2020-08-16 NOTE — Therapy (Signed)
[facility] [address], [place], [address] Phone: [phone]   Fax:  [phone]  Occupational Therapy Treatment  Patient Details  Name: NATOSHIA SOUTER MRN: [id number] Date of Birth: 10-28-1945 Referring Provider (OT): Cassell Smiles   Encounter Date: 08/16/2020   OT End of Session - 08/16/20 1545    Visit Number 6    Number of Visits 8    Date for OT Re-Evaluation 09/08/20    OT Start Time 1515    Activity Tolerance Patient tolerated treatment well    Behavior During Therapy [facility] for tasks assessed/performed           Past Medical History:  Diagnosis Date  . Arthritis   . Cancer ([place]) 2016   left lower eye lid  . GERD (gastroesophageal reflux disease)    occas  . Motion sickness    cars  . Osteoporosis   . Shoulder pain, right   . Vertigo    in past    Past Surgical History:  Procedure Laterality Date  . ABDOMINAL HYSTERECTOMY    . AMPUTATION TOE Right 1980's   right 2nd toe due to infection  . BROW LIFT Bilateral 08/30/2015   Procedure: BLEPHAROPLASTY;  Surgeon: Karle Starch, MD;  Location: [place];  Service: Ophthalmology;  Laterality: Bilateral;  . ECTROPION REPAIR Right 08/30/2015   Procedure: REPAIR OF ECTROPION;  Surgeon: Karle Starch, MD;  Location: [place];  Service: Ophthalmology;  Laterality: Right;  . EYE SURGERY    . FOOT SURGERY    . PTOSIS REPAIR Bilateral 08/30/2015   Procedure: PTOSIS REPAIR;  Surgeon: Karle Starch, MD;  Location: [place];  Service: Ophthalmology;  Laterality: Bilateral;  eyelids  . TONSILLECTOMY AND ADENOIDECTOMY      There were no vitals filed for this visit.   Subjective Assessment - 08/16/20 1542    Subjective  In light activities my wrist okay but certain ones like picking up , pushing up and grip bother me still back of wrist    Pertinent History Pain started in July or Aug -when she did something in kitching and felt  sharp pain - it got better thru the months - had few session with PT - but pain not better- still linger about 3/10 in wrist - more dorsal and radial - was refer to OT/hand therapy    Patient Stated Goals Want the pain better in my L wrist so I can use it to push up , twist and turn things, pull or squeeze    Currently in Pain? Yes    Pain Score 3     Pain Location Wrist    Pain Orientation Left    Pain Descriptors / Indicators Aching   pull   Pain Type Acute pain;Chronic pain    Pain Onset More than a month ago    Pain Frequency Intermittent    Aggravating Factors  gripping, wrist ext and flexion end range - pick up or pushing up              [facility] OT Assessment - 08/16/20 0001      Strength   Right Hand Grip (lbs) 62    Right Hand Lateral Pinch 12 lbs    Right Hand 3 Point Pinch 10 lbs    Left Hand Grip (lbs) 45   pain 3/10   Left Hand Lateral Pinch 12 lbs   pain 2-3/10   Left  Hand 3 Point Pinch 10 lbs   pain 2/10             Pt arrive with prefab wrist splinton fitted at eval date- wearingas much as she can - off for HEP and ADL's only about 3 x day to maintain AROM Wrist AROM cont to be close to WNL -but pain stays about 3/10 over dorsal wrist and - tenderness over TFCCulnar wrist3/10 Grippainful still 3/10 this date   Pt ed on ionto useagain -done ionto on ulnar TFCC this date again- ed on what to expect again - 1.4 current tolerate  - pt to keep patch on for hour  Skin check done prior to ionto          OT Treatments/Exercises (OP) - 08/16/20 0001      Iontophoresis   Type of Iontophoresis Dexamethasone    Location TFCC on L    Dose med patch ,1.5 current    Time 24 min                  OT Education - 08/16/20 1544    Education Details findings of AROM , ionto use    Person(s) Educated Patient    Methods Explanation;Demonstration;Tactile cues;Verbal cues;Handout    Comprehension Verbal cues required;Returned  demonstration;Verbalized understanding               OT Long Term Goals - 07/28/20 1755      OT LONG TERM GOAL #1   Title L wrist AROM increase to WNL with no increase symptoms    Baseline pain or strain for wrist flexion, extention 3/10 - RD /UD stiff - slight pull    Time 3    Period Weeks    Status New    Target Date 08/18/20      OT LONG TERM GOAL #2   Title L grip and prehension strength to be WNL without symptoms to grip, twist , squeez and pull objects    Baseline pain 3/10 with grip , prehension -    Time 6    Period Weeks    Status New    Target Date 09/08/20      OT LONG TERM GOAL #3   Title Pain improve for pt to wean out of splints be able to grip , pull , pushup    Baseline Need splint and or compression -pain 3/10 or more without any splint    Time 4    Period Weeks    Status New    Target Date 08/25/20                 Plan - 08/16/20 1547    Clinical Impression Statement Pt this date for 4th session of ionto over dorsal and radial wrist - pt cont to have L wrist AROM WNL but pain or stretch stay about 3/10 , grip 3/10  and prehension - pt was immobilize with prefab splint , off for AROM for wrist in all planes  3 x day HEP- but cont pain  over dorsal wrist and tenderness on TFCC- discuss with pt cont symptoms and not seeing improvement in OT that I usually expect by now- will contact Cassell Smiles PA - ? if pt can possibly see Dr Candelaria Stagers for further assessment using [place]    OT Occupational Profile and History Problem Focused Assessment - Including review of records relating to presenting problem    Occupational performance deficits (Please refer to evaluation for details): ADL's;IADL's;Play;Leisure;Social Participation  Body Structure / Function / Physical Skills ADL;IADL;Pain;Strength;Edema;Flexibility;ROM;UE functional use    Rehab Potential Fair    Clinical Decision Making Limited treatment options, no task modification necessary    Comorbidities  Affecting Occupational Performance: None    Modification or Assistance to Complete Evaluation  No modification of tasks or assist necessary to complete eval    OT Frequency 2x / week    OT Duration 2 weeks    OT Treatment/Interventions Self-care/ADL training;Ultrasound;Contrast Bath;Fluidtherapy;Paraffin;Iontophoresis;Therapeutic exercise;Manual Therapy;Splinting;Patient/family education    Consulted and Agree with Plan of Care Patient           Patient will benefit from skilled therapeutic intervention in order to improve the following deficits and impairments:   Body Structure / Function / Physical Skills: ADL,IADL,Pain,Strength,Edema,Flexibility,ROM,UE functional use       Visit Diagnosis: Pain in left wrist  Stiffness of left wrist, not elsewhere classified    Problem List Patient Active Problem List   Diagnosis Date Noted  . Dyspnea on exertion 03/07/2017  . Chest pain 03/07/2017  . Rotator cuff tendinitis, right 05/20/2015  . Spondylosis of cervical region without myelopathy or radiculopathy 05/20/2015  . Rotator cuff impingement syndrome of right shoulder 02/05/2014    Rosalyn Gess  OTR/l,CLT 08/16/2020, 4:01 PM  [place] [facility] [address], [place], [address] Phone: [phone]   Fax:  [phone]  Name: LEZA ASHDOWN MRN: [id number] Date of Birth: Sep 18, 1945

## 2020-08-18 ENCOUNTER — Ambulatory Visit: Payer: Medicare Other | Admitting: Occupational Therapy

## 2020-08-24 DIAGNOSIS — M67911 Unspecified disorder of synovium and tendon, right shoulder: Secondary | ICD-10-CM | POA: Diagnosis not present

## 2020-08-25 DIAGNOSIS — M25432 Effusion, left wrist: Secondary | ICD-10-CM | POA: Diagnosis not present

## 2020-08-25 DIAGNOSIS — M778 Other enthesopathies, not elsewhere classified: Secondary | ICD-10-CM | POA: Diagnosis not present

## 2020-08-25 DIAGNOSIS — M25532 Pain in left wrist: Secondary | ICD-10-CM | POA: Diagnosis not present

## 2020-08-25 DIAGNOSIS — M19032 Primary osteoarthritis, left wrist: Secondary | ICD-10-CM | POA: Diagnosis not present

## 2020-08-25 DIAGNOSIS — M25511 Pain in right shoulder: Secondary | ICD-10-CM | POA: Diagnosis not present

## 2020-08-26 ENCOUNTER — Other Ambulatory Visit: Payer: Self-pay

## 2020-08-26 ENCOUNTER — Ambulatory Visit (INDEPENDENT_AMBULATORY_CARE_PROVIDER_SITE_OTHER): Payer: Medicare Other | Admitting: Obstetrics & Gynecology

## 2020-08-26 VITALS — BP 130/80 | HR 72 | Ht 67.75 in | Wt 165.6 lb

## 2020-08-26 DIAGNOSIS — Z01419 Encounter for gynecological examination (general) (routine) without abnormal findings: Secondary | ICD-10-CM

## 2020-08-26 DIAGNOSIS — M810 Age-related osteoporosis without current pathological fracture: Secondary | ICD-10-CM

## 2020-08-26 DIAGNOSIS — Z780 Asymptomatic menopausal state: Secondary | ICD-10-CM

## 2020-08-26 DIAGNOSIS — Z90710 Acquired absence of both cervix and uterus: Secondary | ICD-10-CM

## 2020-08-26 NOTE — Progress Notes (Signed)
Kelly Keith 15-Jun-1946 [id number]   History:    75 y.o. G1P1L1 Married  VQ:[id number] presenting for annual gyn exam   PYK:[id number], well on no HRT. S/P Total Hysterectomy. No pelvic pain. Abstinent. Breasts normal. BMI 25.37. Physically active, walks regularly. Urine/BMs normal. Health labs with Dr Rex Kras.   Past medical history,surgical history, family history and social history were all reviewed and documented in the EPIC chart.  Gynecologic History No LMP recorded. Patient has had a hysterectomy.  Obstetric History OB History  Gravida Para Term Preterm AB Living  1 1       1   SAB IAB Ectopic Multiple Live Births               # Outcome Date GA Lbr Len/2nd Weight Sex Delivery Anes PTL Lv  1 Para              ROS: A ROS was performed and pertinent positives and negatives are included in the history.  GENERAL: No fevers or chills. HEENT: No change in vision, no earache, sore throat or sinus congestion. NECK: No pain or stiffness. CARDIOVASCULAR: No chest pain or pressure. No palpitations. PULMONARY: No shortness of breath, cough or wheeze. GASTROINTESTINAL: No abdominal pain, nausea, vomiting or diarrhea, melena or bright red blood per rectum. GENITOURINARY: No urinary frequency, urgency, hesitancy or dysuria. MUSCULOSKELETAL: No joint or muscle pain, no back pain, no recent trauma. DERMATOLOGIC: No rash, no itching, no lesions. ENDOCRINE: No polyuria, polydipsia, no heat or cold intolerance. No recent change in weight. HEMATOLOGICAL: No anemia or easy bruising or bleeding. NEUROLOGIC: No headache, seizures, numbness, tingling or weakness. PSYCHIATRIC: No depression, no loss of interest in normal activity or change in sleep pattern.     Exam:   BP 130/80 (BP Location: Right Arm)   Pulse 72   Ht 5' 7.75" (1.721 m)   Wt 165 lb 9.6 oz (75.1 kg)   BMI 25.37 kg/m   Body mass index is 25.37 kg/m.  General appearance : Well developed well  nourished female. No acute distress HEENT: Eyes: no retinal hemorrhage or exudates,  Neck supple, trachea midline, no carotid bruits, no thyroidmegaly Lungs: Clear to auscultation, no rhonchi or wheezes, or rib retractions  Heart: Regular rate and rhythm, no murmurs or gallops Breast:Examined in sitting and supine position were symmetrical in appearance, no palpable masses or tenderness,  no skin retraction, no nipple inversion, no nipple discharge, no skin discoloration, no axillary or supraclavicular lymphadenopathy Abdomen: no palpable masses or tenderness, no rebound or guarding Extremities: no edema or skin discoloration or tenderness  Pelvic: Vulva: Normal             Vagina: No gross lesions or discharge  Cervix/Uterus absent  Adnexa  Without masses or tenderness  Anus: Normal   Assessment/Plan:  75 y.o. female for annual exam   1. Well female exam with routine gynecological exam Gynecologic exam status post total hysterectomy. No indication for Pap test at this time. Breast exam normal. Screening mammogram January 2022 was negative. Will do Cologuard's through her family physician. Health labs with family physician. Good body mass index at 25.37. Continue with fitness and healthy nutrition.  2. S/P total hysterectomy  3. Postmenopausal Well on no hormone replacement therapy.  4. Age-related osteoporosis without current pathological fracture Bone density in 2015 showing osteoporosis with a T score of -4.0 at the left third radius. Patient declines repeating a bone density as she would decline bone medication. She is taking  vitamin D supplements with calcium for a total of 1500 mg daily and is doing regular weightbearing physical activities.  Other orders - dextromethorphan-guaiFENesin (ROBITUSSIN-DM) 10-100 MG/5ML liquid; Take by mouth every 4 (four) hours as needed for cough. - rosuvastatin (CRESTOR) 5 MG tablet; Take 5 mg by mouth daily.  [name] MD, 11:55 AM  08/26/2020

## 2020-08-29 ENCOUNTER — Encounter: Payer: Self-pay | Admitting: Obstetrics & Gynecology

## 2020-09-05 ENCOUNTER — Ambulatory Visit: Payer: Medicare Other | Attending: Orthopedic Surgery | Admitting: Occupational Therapy

## 2020-09-05 ENCOUNTER — Other Ambulatory Visit: Payer: Self-pay

## 2020-09-05 DIAGNOSIS — M25632 Stiffness of left wrist, not elsewhere classified: Secondary | ICD-10-CM

## 2020-09-05 DIAGNOSIS — M25532 Pain in left wrist: Secondary | ICD-10-CM

## 2020-09-05 NOTE — Therapy (Signed)
[facility] [address], [place], [address] Phone: [phone]   Fax:  [phone]  Occupational Therapy Treatment  Patient Details  Name: Kelly Keith MRN: [id number] Date of Birth: January 05, 1946 Referring Provider (OT): Cassell Smiles   Encounter Date: 09/05/2020   OT End of Session - 09/05/20 1558    Visit Number 7    Number of Visits 15    Date for OT Re-Evaluation 10/10/20    OT Start Time 1345    OT Stop Time 1425    OT Time Calculation (min) 40 min    Activity Tolerance Patient tolerated treatment well    Behavior During Therapy [facility] for tasks assessed/performed           Past Medical History:  Diagnosis Date  . Arthritis   . Cancer ([place]) 2016   left lower eye lid  . GERD (gastroesophageal reflux disease)    occas  . Motion sickness    cars  . Osteoporosis   . Shoulder pain, right   . Vertigo    in past    Past Surgical History:  Procedure Laterality Date  . ABDOMINAL HYSTERECTOMY    . AMPUTATION TOE Right 1980's   right 2nd toe due to infection  . BROW LIFT Bilateral 08/30/2015   Procedure: BLEPHAROPLASTY;  Surgeon: Karle Starch, MD;  Location: [place];  Service: Ophthalmology;  Laterality: Bilateral;  . ECTROPION REPAIR Right 08/30/2015   Procedure: REPAIR OF ECTROPION;  Surgeon: Karle Starch, MD;  Location: [place];  Service: Ophthalmology;  Laterality: Right;  . EYE SURGERY    . FOOT SURGERY    . PTOSIS REPAIR Bilateral 08/30/2015   Procedure: PTOSIS REPAIR;  Surgeon: Karle Starch, MD;  Location: [place];  Service: Ophthalmology;  Laterality: Bilateral;  eyelids  . TONSILLECTOMY AND ADENOIDECTOMY      There were no vitals filed for this visit.   Subjective Assessment - 09/05/20 1553    Subjective  I seen DR Candelaria Stagers for the [place] - and want to try the medication few times more - still tender or sore over back of wrist to pinkie side - mostly with weight  bearing and bending the wrist back and forth to the end  range - did use splint with activities    Pertinent History Pain started in July or Aug -when she did something in kitching and felt sharp pain - it got better thru the months - had few session with PT - but pain not better- still linger about 3/10 in wrist - more dorsal and radial - was refer to OT/hand therapy    Patient Stated Goals Want the pain better in my L wrist so I can use it to push up , twist and turn things, pull or squeeze    Currently in Pain? Yes    Pain Score 3     Pain Location Wrist   ulnar side and dorsal   Pain Descriptors / Indicators Aching    Pain Type Acute pain;Chronic pain    Pain Onset More than a month ago    Pain Frequency Intermittent              [facility] OT Assessment - 09/05/20 0001      AROM   Left Wrist Extension 79 Degrees   pain 2/10   Left Wrist Flexion 95 Degrees   pain 2/10   Left Wrist Radial Deviation 20 Degrees  Left Wrist Ulnar Deviation 30 Degrees      Strength   Right Hand Grip (lbs) 50    Right Hand Lateral Pinch 14 lbs    Right Hand 3 Point Pinch 10 lbs    Left Hand Grip (lbs) 48    Left Hand Lateral Pinch 12 lbs    Left Hand 3 Point Pinch 12 lbs              Pt arrive with prefab wrist splinton fitted at eval date- wearingas much as she can - off for HEP and  Certain ADL's nad IADL's  Wrist AROM cont to be  WNL -but pain stays about 2-3/10 over dorsal ulnar wrist and - tenderness over TFCCulnar wrist2-3/10    Pt ed on ionto useagain -doneionto on ulnar wrist/ECU n- ed on what to expect again - 1.9currenttolerate - pt to keep patch on for hour  Skin check done prior to ionto - no issues         OT Treatments/Exercises (OP) - 09/05/20 0001      Iontophoresis   Type of Iontophoresis Dexamethasone    Location ECU/TFCC    Dose med patch , 1.9 current    Time 20                  OT Education - 09/05/20 1557    Education Details  Reassessment compare to seen about 2 wks prior to [place] - ionto on ECU/TFCC    Person(s) Educated Patient    Methods Explanation;Demonstration;Tactile cues;Verbal cues;Handout    Comprehension Verbal cues required;Returned demonstration;Verbalized understanding               OT Long Term Goals - 09/05/20 1715      OT LONG TERM GOAL #1   Title L wrist AROM increase to WNL with no increase symptoms    Baseline pain or strain for wrist flexion, extention end range 3/10 -    Time 3    Period Weeks    Status On-going    Target Date 09/26/20      OT LONG TERM GOAL #2   Title L grip and prehension strength to be WNL without symptoms to grip, twist , squeez and pull objects    Baseline pain 3/10  with wrist sup , weight bearing , twisting, or squeezing    Time 5    Period Weeks    Status On-going    Target Date 10/10/20      OT LONG TERM GOAL #3   Title Pain improve for pt to wean out of splints be able to grip , pull , pushup    Baseline Need splint still -pain 3/10 or more without any splint    Time 5    Period Weeks    Status On-going    Target Date 10/10/20                 Plan - 09/05/20 1559    Clinical Impression Statement Pt was seen for wrist pain at end range of wrist flexion and extention- staying about 3/10 - was refer to ortho for [place] - Dr Candelaria Stagers done [place] 08/25/20 showed Minimal enlargement of the dorsal wrist recess likely represents degenerative change. No wrist joint effusion or obvious TFCC abnormality, however, visualization of the TFCC is limited with this modality.   2) Trace fluid around the ECU with questionable subluxed position.  3) No focal soft tissue abnormality over the area of swelling along the  distal radius. This appears to be a large collection of subcutaneous fatty tissue without obvious delineated lipoma.   Pt cont to show tenderness over TFCC and ECU at end range - pt can benefit from ionto , splint and pain free AROM    OT Occupational Profile  and History Problem Focused Assessment - Including review of records relating to presenting problem    Occupational performance deficits (Please refer to evaluation for details): ADL's;IADL's;Play;Leisure;Social Participation    Body Structure / Function / Physical Skills ADL;IADL;Pain;Strength;Edema;Flexibility;ROM;UE functional use    Rehab Potential Fair    Clinical Decision Making Limited treatment options, no task modification necessary    Comorbidities Affecting Occupational Performance: None    Modification or Assistance to Complete Evaluation  No modification of tasks or assist necessary to complete eval    OT Frequency 2x / week    OT Duration --   5 wks   OT Treatment/Interventions Self-care/ADL training;Ultrasound;Contrast Bath;Fluidtherapy;Paraffin;Iontophoresis;Therapeutic exercise;Manual Therapy;Splinting;Patient/family education    Consulted and Agree with Plan of Care Patient           Patient will benefit from skilled therapeutic intervention in order to improve the following deficits and impairments:   Body Structure / Function / Physical Skills: ADL,IADL,Pain,Strength,Edema,Flexibility,ROM,UE functional use       Visit Diagnosis: Stiffness of left wrist, not elsewhere classified - Plan: Ot plan of care cert/re-cert  Pain in left wrist - Plan: Ot plan of care cert/re-cert    Problem List Patient Active Problem List   Diagnosis Date Noted  . Dyspnea on exertion 03/07/2017  . Chest pain 03/07/2017  . Rotator cuff tendinitis, right 05/20/2015  . Spondylosis of cervical region without myelopathy or radiculopathy 05/20/2015  . Rotator cuff impingement syndrome of right shoulder 02/05/2014    Rosalyn Gess OTR/l,CLT 09/05/2020, 5:19 PM  [facility] [address], [place], [address] Phone: [phone]   Fax:  [phone]  Name: Kelly Keith MRN: [id number] Date of Birth: 1946-03-01

## 2020-09-09 ENCOUNTER — Other Ambulatory Visit: Payer: Self-pay

## 2020-09-09 ENCOUNTER — Ambulatory Visit: Payer: Medicare Other | Admitting: Occupational Therapy

## 2020-09-09 DIAGNOSIS — M25632 Stiffness of left wrist, not elsewhere classified: Secondary | ICD-10-CM

## 2020-09-09 DIAGNOSIS — M25532 Pain in left wrist: Secondary | ICD-10-CM | POA: Diagnosis not present

## 2020-09-09 NOTE — Therapy (Signed)
[facility] [address] [place], [address] Phone: [phone]   Fax:  [phone]  Occupational Therapy Treatment  Patient Details  Name: Kelly Keith MRN: [id number] Date of Birth: 02/14/1946 Referring Provider (OT): Cassell Smiles   Encounter Date: 09/09/2020   OT End of Session - 09/09/20 1506    Visit Number 8    Number of Visits 15    Date for OT Re-Evaluation 10/10/20    OT Start Time 1215    OT Stop Time 1247    OT Time Calculation (min) 32 min    Activity Tolerance Patient tolerated treatment well    Behavior During Therapy [facility] for tasks assessed/performed           Past Medical History:  Diagnosis Date  . Arthritis   . Cancer ([place]) 2016   left lower eye lid  . GERD (gastroesophageal reflux disease)    occas  . Motion sickness    cars  . Osteoporosis   . Shoulder pain, right   . Vertigo    in past    Past Surgical History:  Procedure Laterality Date  . ABDOMINAL HYSTERECTOMY    . AMPUTATION TOE Right 1980's   right 2nd toe due to infection  . BROW LIFT Bilateral 08/30/2015   Procedure: BLEPHAROPLASTY;  Surgeon: Karle Starch, MD;  Location: [place];  Service: Ophthalmology;  Laterality: Bilateral;  . ECTROPION REPAIR Right 08/30/2015   Procedure: REPAIR OF ECTROPION;  Surgeon: Karle Starch, MD;  Location: [place];  Service: Ophthalmology;  Laterality: Right;  . EYE SURGERY    . FOOT SURGERY    . PTOSIS REPAIR Bilateral 08/30/2015   Procedure: PTOSIS REPAIR;  Surgeon: Karle Starch, MD;  Location: [place];  Service: Ophthalmology;  Laterality: Bilateral;  eyelids  . TONSILLECTOMY AND ADENOIDECTOMY      There were no vitals filed for this visit.   Subjective Assessment - 09/09/20 1505    Subjective  I felt my wrist little better -but then this am I twist my hand/wrist certain way back with pinkie extended and felt the pain    Pertinent History Pain  started in July or Aug -when she did something in kitching and felt sharp pain - it got better thru the months - had few session with PT - but pain not better- still linger about 3/10 in wrist - more dorsal and radial - was refer to OT/hand therapy    Patient Stated Goals Want the pain better in my L wrist so I can use it to push up , twist and turn things, pull or squeeze    Currently in Pain? Yes    Pain Score 2     Pain Location Wrist    Pain Orientation Left    Pain Descriptors / Indicators Aching    Pain Type Acute pain;Chronic pain    Pain Onset More than a month ago    Pain Frequency Intermittent           Pt arrive with prefab wrist splinton fittedat eval date- wearingas much as she can - off for HEP and  Certain ADL's and IADL's  Wrist AROM cont to be  WNL -but pain stays about 2-3/10 over dorsal ulnar wrist and- tenderness over TFCCulnar wrist2-3/10    Pt ed on ionto useagain -doneiontoon ulnar wrist/ECU - ed on what to expect again - 1.9currenttolerate - pt to keep patch on  for hour Skin check done prior to ionto- no issues             OT Treatments/Exercises (OP) - 09/09/20 0001      Iontophoresis   Type of Iontophoresis Dexamethasone    Location ECU/TFCC    Dose med patch , 1.9 current    Time 20                  OT Education - 09/09/20 1506    Education Details ionto use and splint wearing    Person(s) Educated Patient    Methods Explanation;Demonstration;Tactile cues;Verbal cues;Handout    Comprehension Verbal cues required;Returned demonstration;Verbalized understanding               OT Long Term Goals - 09/05/20 1715      OT LONG TERM GOAL #1   Title L wrist AROM increase to WNL with no increase symptoms    Baseline pain or strain for wrist flexion, extention end range 3/10 -    Time 3    Period Weeks    Status On-going    Target Date 09/26/20      OT LONG TERM GOAL #2   Title L grip and prehension strength  to be WNL without symptoms to grip, twist , squeez and pull objects    Baseline pain 3/10  with wrist sup , weight bearing , twisting, or squeezing    Time 5    Period Weeks    Status On-going    Target Date 10/10/20      OT LONG TERM GOAL #3   Title Pain improve for pt to wean out of splints be able to grip , pull , pushup    Baseline Need splint still -pain 3/10 or more without any splint    Time 5    Period Weeks    Status On-going    Target Date 10/10/20                 Plan - 09/09/20 1507    Clinical Impression Statement Pt seen for wrist pain at end range for wrist flexion , extention and combination motion of wrist exr/UD - tender over TFCC -and pain reported mostly over ECU - ionto done this date - pt cont to show AROM WNL with ache or pain at end range - pt to contto wear wrist splint with any activiites that cause pain    OT Occupational Profile and History Problem Focused Assessment - Including review of records relating to presenting problem    Occupational performance deficits (Please refer to evaluation for details): ADL's;IADL's;Play;Leisure;Social Participation    Body Structure / Function / Physical Skills ADL;IADL;Pain;Strength;Edema;Flexibility;ROM;UE functional use    Rehab Potential Fair    Clinical Decision Making Limited treatment options, no task modification necessary    Comorbidities Affecting Occupational Performance: None    Modification or Assistance to Complete Evaluation  No modification of tasks or assist necessary to complete eval    OT Frequency 2x / week    OT Duration 4 weeks    OT Treatment/Interventions Self-care/ADL training;Ultrasound;Contrast Bath;Fluidtherapy;Paraffin;Iontophoresis;Therapeutic exercise;Manual Therapy;Splinting;Patient/family education    Consulted and Agree with Plan of Care Patient           Patient will benefit from skilled therapeutic intervention in order to improve the following deficits and impairments:    Body Structure / Function / Physical Skills: ADL,IADL,Pain,Strength,Edema,Flexibility,ROM,UE functional use       Visit Diagnosis: Stiffness of left wrist, not elsewhere classified  Pain in left wrist    Problem List Patient Active Problem List   Diagnosis Date Noted  . Dyspnea on exertion 03/07/2017  . Chest pain 03/07/2017  . Rotator cuff tendinitis, right 05/20/2015  . Spondylosis of cervical region without myelopathy or radiculopathy 05/20/2015  . Rotator cuff impingement syndrome of right shoulder 02/05/2014    Rosalyn Gess OTR/L,CLT 09/09/2020, 3:10 PM  [facility] [address] [place], [address] Phone: [phone]   Fax:  [phone]  Name: Kelly Keith MRN: [id number] Date of Birth: [birth year]

## 2020-09-12 ENCOUNTER — Other Ambulatory Visit: Payer: Self-pay

## 2020-09-12 ENCOUNTER — Ambulatory Visit: Payer: Medicare Other | Admitting: Occupational Therapy

## 2020-09-12 DIAGNOSIS — M25632 Stiffness of left wrist, not elsewhere classified: Secondary | ICD-10-CM | POA: Diagnosis not present

## 2020-09-12 DIAGNOSIS — M25532 Pain in left wrist: Secondary | ICD-10-CM

## 2020-09-13 ENCOUNTER — Encounter: Payer: Self-pay | Admitting: Occupational Therapy

## 2020-09-13 DIAGNOSIS — R399 Unspecified symptoms and signs involving the genitourinary system: Secondary | ICD-10-CM | POA: Diagnosis not present

## 2020-09-13 DIAGNOSIS — N390 Urinary tract infection, site not specified: Secondary | ICD-10-CM | POA: Diagnosis not present

## 2020-09-13 DIAGNOSIS — N183 Chronic kidney disease, stage 3 unspecified: Secondary | ICD-10-CM | POA: Diagnosis not present

## 2020-09-13 NOTE — Therapy (Signed)
[facility] [address] [place], [address] Phone: [phone]   Fax:  [phone]  Occupational Therapy Treatment  Patient Details  Name: Kelly Keith MRN: [id number] Date of Birth: 28-Jan-1946 Referring Provider (OT): Cassell Smiles   Encounter Date: 09/12/2020   OT End of Session - 09/13/20 2030    Visit Number 9    Number of Visits 15    Date for OT Re-Evaluation 10/10/20    OT Start Time 1546    OT Stop Time 1630    OT Time Calculation (min) 44 min    Activity Tolerance Patient tolerated treatment well    Behavior During Therapy [facility] for tasks assessed/performed           Past Medical History:  Diagnosis Date  . Arthritis   . Cancer ([place]) 2016   left lower eye lid  . GERD (gastroesophageal reflux disease)    occas  . Motion sickness    cars  . Osteoporosis   . Shoulder pain, right   . Vertigo    in past    Past Surgical History:  Procedure Laterality Date  . ABDOMINAL HYSTERECTOMY    . AMPUTATION TOE Right 1980's   right 2nd toe due to infection  . BROW LIFT Bilateral 08/30/2015   Procedure: BLEPHAROPLASTY;  Surgeon: Karle Starch, MD;  Location: [place];  Service: Ophthalmology;  Laterality: Bilateral;  . ECTROPION REPAIR Right 08/30/2015   Procedure: REPAIR OF ECTROPION;  Surgeon: Karle Starch, MD;  Location: [place];  Service: Ophthalmology;  Laterality: Right;  . EYE SURGERY    . FOOT SURGERY    . PTOSIS REPAIR Bilateral 08/30/2015   Procedure: PTOSIS REPAIR;  Surgeon: Karle Starch, MD;  Location: [place];  Service: Ophthalmology;  Laterality: Bilateral;  eyelids  . TONSILLECTOMY AND ADENOIDECTOMY      There were no vitals filed for this visit.   Subjective Assessment - 09/13/20 2028    Subjective  Pt reports she is doing well    Pertinent History Pain started in July or Aug -when she did something in kitching and felt sharp pain - it got better thru  the months - had few session with PT - but pain not better- still linger about 3/10 in wrist - more dorsal and radial - was refer to OT/hand therapy    Patient Stated Goals Want the pain better in my L wrist so I can use it to push up , twist and turn things, pull or squeeze    Currently in Pain? Yes    Pain Score 1     Pain Location Wrist    Pain Orientation Left    Pain Descriptors / Indicators Aching    Pain Type Chronic pain    Pain Onset More than a month ago    Pain Frequency Intermittent           Ionto as outlined below in flow sheet, skin intact prior to tx, pt to keep patch on for an hour after treatment.   Decreased pain to 1/10 today             OT Treatments/Exercises (OP) - 09/13/20 0001      Iontophoresis   Type of Iontophoresis Dexamethasone    Location ECU/TFCC    Dose med patch , 1.2 current    Time 37  OT Education - 09/13/20 2029    Education Details ionto use and splint wearing    Person(s) Educated Patient    Methods Explanation;Demonstration;Tactile cues;Verbal cues;Handout    Comprehension Verbal cues required;Returned demonstration;Verbalized understanding               OT Long Term Goals - 09/05/20 1715      OT LONG TERM GOAL #1   Title L wrist AROM increase to WNL with no increase symptoms    Baseline pain or strain for wrist flexion, extention end range 3/10 -    Time 3    Period Weeks    Status On-going    Target Date 09/26/20      OT LONG TERM GOAL #2   Title L grip and prehension strength to be WNL without symptoms to grip, twist , squeez and pull objects    Baseline pain 3/10  with wrist sup , weight bearing , twisting, or squeezing    Time 5    Period Weeks    Status On-going    Target Date 10/10/20      OT LONG TERM GOAL #3   Title Pain improve for pt to wean out of splints be able to grip , pull , pushup    Baseline Need splint still -pain 3/10 or more without any splint    Time 5     Period Weeks    Status On-going    Target Date 10/10/20                 Plan - 09/13/20 2030    Clinical Impression Statement MInimal pain this date, 1/10, progressing well with use of iontophoresis to decrease pain and inflammation and improve ROM and functional use of hand.  Mild tenderness over TFCC.  Wears splint as needed for more complex tasks that may cause pain.  Intensity of ionto tx decreased this date for patient toleration, 2# weight over pad during treatment.  Continue to work towards goals to decrease pain, increase use of left UE for necessary daily tasks.    OT Occupational Profile and History Problem Focused Assessment - Including review of records relating to presenting problem    Occupational performance deficits (Please refer to evaluation for details): ADL's;IADL's;Play;Leisure;Social Participation    Body Structure / Function / Physical Skills ADL;IADL;Pain;Strength;Edema;Flexibility;ROM;UE functional use    Rehab Potential Fair    Clinical Decision Making Limited treatment options, no task modification necessary    Comorbidities Affecting Occupational Performance: None    Modification or Assistance to Complete Evaluation  No modification of tasks or assist necessary to complete eval    OT Frequency 2x / week    OT Duration 4 weeks    OT Treatment/Interventions Self-care/ADL training;Ultrasound;Contrast Bath;Fluidtherapy;Paraffin;Iontophoresis;Therapeutic exercise;Manual Therapy;Splinting;Patient/family education    Consulted and Agree with Plan of Care Patient           Patient will benefit from skilled therapeutic intervention in order to improve the following deficits and impairments:   Body Structure / Function / Physical Skills: ADL,IADL,Pain,Strength,Edema,Flexibility,ROM,UE functional use       Visit Diagnosis: Stiffness of left wrist, not elsewhere classified  Pain in left wrist    Problem List Patient Active Problem List   Diagnosis Date  Noted  . Dyspnea on exertion 03/07/2017  . Chest pain 03/07/2017  . Rotator cuff tendinitis, right 05/20/2015  . Spondylosis of cervical region without myelopathy or radiculopathy 05/20/2015  . Rotator cuff impingement syndrome of right shoulder 02/05/2014  Achilles Dunk, OTR/L, CLT  Arin Vanosdol 09/13/2020, 8:38 PM  [facility] [address] [place], [address] Phone: [phone]   Fax:  [phone]  Name: EVAGELIA KNACK MRN: [id number] Date of Birth: [birth year]

## 2020-09-16 ENCOUNTER — Other Ambulatory Visit: Payer: Self-pay

## 2020-09-16 ENCOUNTER — Ambulatory Visit: Payer: Medicare Other | Admitting: Occupational Therapy

## 2020-09-16 DIAGNOSIS — M25632 Stiffness of left wrist, not elsewhere classified: Secondary | ICD-10-CM

## 2020-09-16 DIAGNOSIS — M25532 Pain in left wrist: Secondary | ICD-10-CM | POA: Diagnosis not present

## 2020-09-16 NOTE — Therapy (Signed)
[facility] [address] [place], [address] Phone: [phone]   Fax:  [phone]  Occupational Therapy Treatment/PN for 10 visits   Patient Details  Name: Kelly Keith MRN: [id number] Date of Birth: 1946/04/06 Referring Provider (OT): Cassell Smiles   Encounter Date: 09/16/2020   OT End of Session - 09/16/20 1232    Visit Number 10    Number of Visits 15    Date for OT Re-Evaluation 10/10/20    OT Start Time 1130    OT Stop Time 1203    OT Time Calculation (min) 33 min    Activity Tolerance Patient tolerated treatment well    Behavior During Therapy [facility] for tasks assessed/performed           Past Medical History:  Diagnosis Date  . Arthritis   . Cancer ([place]) 2016   left lower eye lid  . GERD (gastroesophageal reflux disease)    occas  . Motion sickness    cars  . Osteoporosis   . Shoulder pain, right   . Vertigo    in past    Past Surgical History:  Procedure Laterality Date  . ABDOMINAL HYSTERECTOMY    . AMPUTATION TOE Right 1980's   right 2nd toe due to infection  . BROW LIFT Bilateral 08/30/2015   Procedure: BLEPHAROPLASTY;  Surgeon: Karle Starch, MD;  Location: [place];  Service: Ophthalmology;  Laterality: Bilateral;  . ECTROPION REPAIR Right 08/30/2015   Procedure: REPAIR OF ECTROPION;  Surgeon: Karle Starch, MD;  Location: [place];  Service: Ophthalmology;  Laterality: Right;  . EYE SURGERY    . FOOT SURGERY    . PTOSIS REPAIR Bilateral 08/30/2015   Procedure: PTOSIS REPAIR;  Surgeon: Karle Starch, MD;  Location: [place];  Service: Ophthalmology;  Laterality: Bilateral;  eyelids  . TONSILLECTOMY AND ADENOIDECTOMY      There were no vitals filed for this visit.   Subjective Assessment - 09/16/20 1231    Subjective  I think my pain is little better    Pertinent History Pain started in July or Aug -when she did something in kitching and felt sharp  pain - it got better thru the months - had few session with PT - but pain not better- still linger about 3/10 in wrist - more dorsal and radial - was refer to OT/hand therapy    Patient Stated Goals Want the pain better in my L wrist so I can use it to push up , twist and turn things, pull or squeeze    Currently in Pain? Yes    Pain Score 1     Pain Location Wrist    Pain Orientation Left    Pain Descriptors / Indicators Aching    Pain Type Chronic pain    Pain Onset More than a month ago    Pain Frequency Intermittent    Aggravating Factors  end range flexion , ext of wrist              Pt AROM WNL -and pain at end range over dorsal ulnar wrist last 5-10 degrees  but decrease to 1-2/10  And tenderness over TFCC also 1-2/10 this date  Pt cont wearing splint with activities that cause pain        SKin check done prior - no issues - tolerate well - can wear patch for about hour afterwards    OT Treatments/Exercises (OP) -  09/16/20 0001      Iontophoresis   Type of Iontophoresis Dexamethasone    Location ECU/TFCC    Dose med patch , 1.6 current    Time 24                  OT Education - 09/16/20 1232    Education Details ionto use and splint wearing    Person(s) Educated Patient    Methods Explanation;Demonstration;Tactile cues;Verbal cues;Handout    Comprehension Verbal cues required;Returned demonstration;Verbalized understanding               OT Long Term Goals - 09/16/20 1234      OT LONG TERM GOAL #1   Title L wrist AROM increase to WNL with no increase symptoms    Baseline pain or strain for wrist flexion, extention end range 3/10 - AROM WNL pain about 1-2/10 last 5-10 degrees    Time 3    Period Weeks    Status On-going    Target Date 09/26/20      OT LONG TERM GOAL #2   Title L grip and prehension strength to be WNL without symptoms to grip, twist , squeez and pull objects    Baseline pain 3/10  with wrist sup , weight bearing ,  twisting, or squeezing- NOW pain/tenderness decrease 1-2/10    Time 4    Period Weeks    Status On-going    Target Date 10/10/20      OT LONG TERM GOAL #3   Title Pain improve for pt to wean out of splints be able to grip , pull , pushup    Baseline Need splint still -pain 3/10 or more without any splint- NOW splint still needed - pain decrease 1-2/10    Time 4    Period Weeks    Status On-going    Target Date 10/10/20                 Plan - 09/16/20 1233    Clinical Impression Statement Pt was seen for wrist pain at end range of wrist flexion and extention- staying about 3/10 - was refer to ortho for [place] - Dr Candelaria Stagers done [place] 08/25/20 showed Minimal enlargement of the dorsal wrist recess likely represents degenerative change. No wrist joint effusion or obvious TFCC abnormality, however, visualization of the TFCC is limited with this modality.   2) Trace fluid around the ECU with questionable subluxed position.  3) No focal soft tissue abnormality over the area of swelling along the distal radius. This appears to be a large collection of subcutaneous fatty tissue without obvious delineated lipoma.Pt refered back to OT to adress pain and doing ionto   Pt cont to show tenderness over TFCC and ECU at end range for flexion , ext - but pain decreasing this week to 1-2/10  - pt can benefit from cont ionto , splint and pain free AROM    OT Occupational Profile and History Problem Focused Assessment - Including review of records relating to presenting problem    Occupational performance deficits (Please refer to evaluation for details): ADL's;IADL's;Play;Leisure;Social Participation    Body Structure / Function / Physical Skills ADL;IADL;Pain;Strength;Edema;Flexibility;ROM;UE functional use    Rehab Potential Fair    Clinical Decision Making Limited treatment options, no task modification necessary    Comorbidities Affecting Occupational Performance: None    Modification or Assistance to Complete  Evaluation  No modification of tasks or assist necessary to complete eval    OT  Frequency 2x / week    OT Duration 4 weeks    OT Treatment/Interventions Self-care/ADL training;Ultrasound;Contrast Bath;Fluidtherapy;Paraffin;Iontophoresis;Therapeutic exercise;Manual Therapy;Splinting;Patient/family education    Consulted and Agree with Plan of Care Patient           Patient will benefit from skilled therapeutic intervention in order to improve the following deficits and impairments:   Body Structure / Function / Physical Skills: ADL,IADL,Pain,Strength,Edema,Flexibility,ROM,UE functional use       Visit Diagnosis: Stiffness of left wrist, not elsewhere classified  Pain in left wrist    Problem List Patient Active Problem List   Diagnosis Date Noted  . Dyspnea on exertion 03/07/2017  . Chest pain 03/07/2017  . Rotator cuff tendinitis, right 05/20/2015  . Spondylosis of cervical region without myelopathy or radiculopathy 05/20/2015  . Rotator cuff impingement syndrome of right shoulder 02/05/2014    Rosalyn Gess OTR/l,CLT 09/16/2020, 12:38 PM  [facility] [address], [place], [address] Phone: [phone]   Fax:  [phone]  Name: Kelly Keith MRN: [id number] Date of Birth: 1945/09/28

## 2020-09-19 ENCOUNTER — Other Ambulatory Visit: Payer: Self-pay

## 2020-09-19 ENCOUNTER — Ambulatory Visit: Payer: Medicare Other | Admitting: Occupational Therapy

## 2020-09-19 DIAGNOSIS — M25632 Stiffness of left wrist, not elsewhere classified: Secondary | ICD-10-CM

## 2020-09-19 DIAGNOSIS — M25532 Pain in left wrist: Secondary | ICD-10-CM | POA: Diagnosis not present

## 2020-09-19 NOTE — Therapy (Signed)
[facility] [address], [place], [address] Phone: [phone]   Fax:  [phone]  Occupational Therapy Treatment  Patient Details  Name: Kelly Keith MRN: [id number] Date of Birth: 07/22/1946 Referring Provider (OT): Cassell Smiles   Encounter Date: 09/19/2020   OT End of Session - 09/19/20 1209    Visit Number 11    Number of Visits 15    Date for OT Re-Evaluation 10/10/20    OT Start Time 1229    OT Stop Time 1259    OT Time Calculation (min) 30 min    Activity Tolerance Patient tolerated treatment well    Behavior During Therapy [facility] for tasks assessed/performed           Past Medical History:  Diagnosis Date  . Arthritis   . Cancer ([place]) 2016   left lower eye lid  . GERD (gastroesophageal reflux disease)    occas  . Motion sickness    cars  . Osteoporosis   . Shoulder pain, right   . Vertigo    in past    Past Surgical History:  Procedure Laterality Date  . ABDOMINAL HYSTERECTOMY    . AMPUTATION TOE Right 1980's   right 2nd toe due to infection  . BROW LIFT Bilateral 08/30/2015   Procedure: BLEPHAROPLASTY;  Surgeon: Karle Starch, MD;  Location: [place];  Service: Ophthalmology;  Laterality: Bilateral;  . ECTROPION REPAIR Right 08/30/2015   Procedure: REPAIR OF ECTROPION;  Surgeon: Karle Starch, MD;  Location: [place];  Service: Ophthalmology;  Laterality: Right;  . EYE SURGERY    . FOOT SURGERY    . PTOSIS REPAIR Bilateral 08/30/2015   Procedure: PTOSIS REPAIR;  Surgeon: Karle Starch, MD;  Location: [place];  Service: Ophthalmology;  Laterality: Bilateral;  eyelids  . TONSILLECTOMY AND ADENOIDECTOMY      There were no vitals filed for this visit.   Subjective Assessment - 09/19/20 1209    Subjective  Pain about the same - did feel pain around the house    Pertinent History Pain started in July or Aug -when she did something in kitching and felt sharp  pain - it got better thru the months - had few session with PT - but pain not better- still linger about 3/10 in wrist - more dorsal and radial - was refer to OT/hand therapy    Patient Stated Goals Want the pain better in my L wrist so I can use it to push up , twist and turn things, pull or squeeze    Currently in Pain? Yes    Pain Score 2     Pain Location Wrist    Pain Orientation Left    Pain Descriptors / Indicators Aching    Pain Type Chronic pain    Pain Onset More than a month ago    Pain Frequency Intermittent    Aggravating Factors  end range for wrist extention               Pt AROM WNL -and pain at end range over dorsal ulnar wrist last 5-10 degrees  but decrease to 1-2/10 - around ulnar styloid reported this date And cont tenderness over TFCC also 1-2/10 this date  Pt cont wearing splint with activities that cause pain   SKin check done prior - no issues - tolerate well - can wear patch for about hour afterwards  OT Treatments/Exercises (OP) - 09/19/20 0001      Iontophoresis   Type of Iontophoresis Dexamethasone    Location ECU/TFCC    Dose med patch , 1.6 current    Time 24                  OT Education - 09/19/20 1209    Education Details ionto use and splint wearing    Person(s) Educated Patient    Methods Explanation;Demonstration;Tactile cues;Verbal cues;Handout    Comprehension Verbal cues required;Returned demonstration;Verbalized understanding               OT Long Term Goals - 09/16/20 1234      OT LONG TERM GOAL #1   Title L wrist AROM increase to WNL with no increase symptoms    Baseline pain or strain for wrist flexion, extention end range 3/10 - AROM WNL pain about 1-2/10 last 5-10 degrees    Time 3    Period Weeks    Status On-going    Target Date 09/26/20      OT LONG TERM GOAL #2   Title L grip and prehension strength to be WNL without symptoms to grip, twist , squeez and pull objects    Baseline pain  3/10  with wrist sup , weight bearing , twisting, or squeezing- NOW pain/tenderness decrease 1-2/10    Time 4    Period Weeks    Status On-going    Target Date 10/10/20      OT LONG TERM GOAL #3   Title Pain improve for pt to wean out of splints be able to grip , pull , pushup    Baseline Need splint still -pain 3/10 or more without any splint- NOW splint still needed - pain decrease 1-2/10    Time 4    Period Weeks    Status On-going    Target Date 10/10/20                 Plan - 09/19/20 1210    Clinical Impression Statement Pt cont to have L ulnar wrist pain with end range wrist flexion , extention - tenderness at TFCC/over ECU - cont to benefit from ionto with dexamethazone    OT Occupational Profile and History Problem Focused Assessment - Including review of records relating to presenting problem    Occupational performance deficits (Please refer to evaluation for details): ADL's;IADL's;Play;Leisure;Social Participation    Body Structure / Function / Physical Skills ADL;IADL;Pain;Strength;Edema;Flexibility;ROM;UE functional use    Rehab Potential Fair    Clinical Decision Making Limited treatment options, no task modification necessary    Comorbidities Affecting Occupational Performance: None    Modification or Assistance to Complete Evaluation  No modification of tasks or assist necessary to complete eval    OT Frequency 2x / week    OT Duration 4 weeks    OT Treatment/Interventions Self-care/ADL training;Ultrasound;Contrast Bath;Fluidtherapy;Paraffin;Iontophoresis;Therapeutic exercise;Manual Therapy;Splinting;Patient/family education    Consulted and Agree with Plan of Care Patient           Patient will benefit from skilled therapeutic intervention in order to improve the following deficits and impairments:   Body Structure / Function / Physical Skills: ADL,IADL,Pain,Strength,Edema,Flexibility,ROM,UE functional use       Visit Diagnosis: Stiffness of left  wrist, not elsewhere classified  Pain in left wrist    Problem List Patient Active Problem List   Diagnosis Date Noted  . Dyspnea on exertion 03/07/2017  . Chest pain 03/07/2017  . Rotator cuff tendinitis,  right 05/20/2015  . Spondylosis of cervical region without myelopathy or radiculopathy 05/20/2015  . Rotator cuff impingement syndrome of right shoulder 02/05/2014    Rosalyn Gess OTR/L,CLT 09/19/2020, 12:42 PM  [facility] [address], [place], [address] Phone: [phone]   Fax:  [phone]  Name: Kelly Keith MRN: [id number] Date of Birth: 16-Jun-1946

## 2020-09-21 ENCOUNTER — Ambulatory Visit: Payer: Medicare Other | Attending: Orthopedic Surgery | Admitting: Occupational Therapy

## 2020-09-21 ENCOUNTER — Other Ambulatory Visit: Payer: Self-pay

## 2020-09-21 DIAGNOSIS — M25632 Stiffness of left wrist, not elsewhere classified: Secondary | ICD-10-CM

## 2020-09-21 DIAGNOSIS — M25532 Pain in left wrist: Secondary | ICD-10-CM | POA: Diagnosis not present

## 2020-09-21 NOTE — Therapy (Signed)
[facility] [address] [place], [address] Phone: [phone]   Fax:  [phone]  Occupational Therapy Treatment  Patient Details  Name: Kelly Keith MRN: [id number] Date of Birth: 06/25/1946 Referring Provider (OT): Cassell Smiles   Encounter Date: 09/21/2020   OT End of Session - 09/21/20 1800    Visit Number 12    Number of Visits 15    Date for OT Re-Evaluation 10/10/20    OT Start Time 1417    OT Stop Time 1458    OT Time Calculation (min) 41 min           Past Medical History:  Diagnosis Date  . Arthritis   . Cancer ([place]) 2016   left lower eye lid  . GERD (gastroesophageal reflux disease)    occas  . Motion sickness    cars  . Osteoporosis   . Shoulder pain, right   . Vertigo    in past    Past Surgical History:  Procedure Laterality Date  . ABDOMINAL HYSTERECTOMY    . AMPUTATION TOE Right 1980's   right 2nd toe due to infection  . BROW LIFT Bilateral 08/30/2015   Procedure: BLEPHAROPLASTY;  Surgeon: Karle Starch, MD;  Location: [place];  Service: Ophthalmology;  Laterality: Bilateral;  . ECTROPION REPAIR Right 08/30/2015   Procedure: REPAIR OF ECTROPION;  Surgeon: Karle Starch, MD;  Location: [place];  Service: Ophthalmology;  Laterality: Right;  . EYE SURGERY    . FOOT SURGERY    . PTOSIS REPAIR Bilateral 08/30/2015   Procedure: PTOSIS REPAIR;  Surgeon: Karle Starch, MD;  Location: [place];  Service: Ophthalmology;  Laterality: Bilateral;  eyelids  . TONSILLECTOMY AND ADENOIDECTOMY      There were no vitals filed for this visit.   Subjective Assessment - 09/21/20 1756    Subjective  I try not to push up on my hand  - but sometimes forget at night time - but pain is about 1-2/10    Pertinent History Pain started in July or Aug -when she did something in kitching and felt sharp pain - it got better thru the months - had few session with PT - but pain  not better- still linger about 3/10 in wrist - more dorsal and radial - was refer to OT/hand therapy    Patient Stated Goals Want the pain better in my L wrist so I can use it to push up , twist and turn things, pull or squeeze    Currently in Pain? Yes    Pain Score 1     Pain Location Wrist    Pain Orientation Left    Pain Descriptors / Indicators Aching    Pain Type Chronic pain;Acute pain                Pt AROM WNL -and pain at end range over dorsal ulnar wrist last 5-10 degrees but decrease to 1-2/10 - around ulnar styloid reported this date Tenderness over TFCC also better Pt cont wearing splint with activities that cause pain  Done some graston tool nr 2 sweeping over volar and radial wrist and forearm prior to ionto  SKin check done prior - no issues - tolerate well - can wear patch for about hour afterwards         OT Treatments/Exercises (OP) - 09/21/20 0001      Iontophoresis   Type of Iontophoresis Dexamethasone  Location ECU/TFCC    Dose med patch , 1.6 current    Time 25                  OT Education - 09/21/20 1800    Education Details ionto use and splint wearing    Person(s) Educated Patient    Methods Explanation;Demonstration;Tactile cues;Verbal cues;Handout    Comprehension Verbal cues required;Returned demonstration;Verbalized understanding               OT Long Term Goals - 09/16/20 1234      OT LONG TERM GOAL #1   Title L wrist AROM increase to WNL with no increase symptoms    Baseline pain or strain for wrist flexion, extention end range 3/10 - AROM WNL pain about 1-2/10 last 5-10 degrees    Time 3    Period Weeks    Status On-going    Target Date 09/26/20      OT LONG TERM GOAL #2   Title L grip and prehension strength to be WNL without symptoms to grip, twist , squeez and pull objects    Baseline pain 3/10  with wrist sup , weight bearing , twisting, or squeezing- NOW pain/tenderness decrease 1-2/10    Time 4     Period Weeks    Status On-going    Target Date 10/10/20      OT LONG TERM GOAL #3   Title Pain improve for pt to wean out of splints be able to grip , pull , pushup    Baseline Need splint still -pain 3/10 or more without any splint- NOW splint still needed - pain decrease 1-2/10    Time 4    Period Weeks    Status On-going    Target Date 10/10/20                 Plan - 09/21/20 1802    Clinical Impression Statement Pt report decrease pain - now only 1-2/10 - do not increase anymore to 3/10  - tenderness less over TFCC, pull at end range flexion , extention only - cont ionto dexamethazone - had 6th session this date    OT Occupational Profile and History Problem Focused Assessment - Including review of records relating to presenting problem    Occupational performance deficits (Please refer to evaluation for details): ADL's;IADL's;Play;Leisure;Social Participation    Body Structure / Function / Physical Skills ADL;IADL;Pain;Strength;Edema;Flexibility;ROM;UE functional use    Rehab Potential Fair    Clinical Decision Making Limited treatment options, no task modification necessary    Comorbidities Affecting Occupational Performance: None    Modification or Assistance to Complete Evaluation  No modification of tasks or assist necessary to complete eval    OT Frequency 2x / week    OT Duration 4 weeks    OT Treatment/Interventions Self-care/ADL training;Ultrasound;Contrast Bath;Fluidtherapy;Paraffin;Iontophoresis;Therapeutic exercise;Manual Therapy;Splinting;Patient/family education    Consulted and Agree with Plan of Care Patient           Patient will benefit from skilled therapeutic intervention in order to improve the following deficits and impairments:   Body Structure / Function / Physical Skills: ADL,IADL,Pain,Strength,Edema,Flexibility,ROM,UE functional use       Visit Diagnosis: Stiffness of left wrist, not elsewhere classified  Pain in left  wrist    Problem List Patient Active Problem List   Diagnosis Date Noted  . Dyspnea on exertion 03/07/2017  . Chest pain 03/07/2017  . Rotator cuff tendinitis, right 05/20/2015  . Spondylosis of cervical region without myelopathy  or radiculopathy 05/20/2015  . Rotator cuff impingement syndrome of right shoulder 02/05/2014    Rosalyn Gess OTR/L,CLT 09/21/2020, 6:08 PM  [facility] [address] [place], [address] Phone: [phone]   Fax:  [phone]  Name: Kelly Keith MRN: [id number] Date of Birth: May 16, 1946

## 2020-09-28 ENCOUNTER — Other Ambulatory Visit: Payer: Self-pay

## 2020-09-28 ENCOUNTER — Ambulatory Visit: Payer: Medicare Other | Admitting: Occupational Therapy

## 2020-09-28 DIAGNOSIS — M25532 Pain in left wrist: Secondary | ICD-10-CM

## 2020-09-28 DIAGNOSIS — M25632 Stiffness of left wrist, not elsewhere classified: Secondary | ICD-10-CM

## 2020-09-28 NOTE — Therapy (Signed)
[facility] [address] [place], [address] Phone: [phone]   Fax:  [phone]  Occupational Therapy Treatment  Patient Details  Name: Kelly Keith MRN: [id number] Date of Birth: 1946/01/13 Referring Provider (OT): Cassell Smiles   Encounter Date: 09/28/2020   OT End of Session - 09/28/20 1406    Visit Number 13    Number of Visits 15    Date for OT Re-Evaluation 10/10/20    OT Start Time 1346    OT Stop Time 1421    OT Time Calculation (min) 35 min    Activity Tolerance Patient tolerated treatment well    Behavior During Therapy [facility] for tasks assessed/performed           Past Medical History:  Diagnosis Date  . Arthritis   . Cancer ([place]) 2016   left lower eye lid  . GERD (gastroesophageal reflux disease)    occas  . Motion sickness    cars  . Osteoporosis   . Shoulder pain, right   . Vertigo    in past    Past Surgical History:  Procedure Laterality Date  . ABDOMINAL HYSTERECTOMY    . AMPUTATION TOE Right 1980's   right 2nd toe due to infection  . BROW LIFT Bilateral 08/30/2015   Procedure: BLEPHAROPLASTY;  Surgeon: Karle Starch, MD;  Location: [place];  Service: Ophthalmology;  Laterality: Bilateral;  . ECTROPION REPAIR Right 08/30/2015   Procedure: REPAIR OF ECTROPION;  Surgeon: Karle Starch, MD;  Location: [place];  Service: Ophthalmology;  Laterality: Right;  . EYE SURGERY    . FOOT SURGERY    . PTOSIS REPAIR Bilateral 08/30/2015   Procedure: PTOSIS REPAIR;  Surgeon: Karle Starch, MD;  Location: [place];  Service: Ophthalmology;  Laterality: Bilateral;  eyelids  . TONSILLECTOMY AND ADENOIDECTOMY      There were no vitals filed for this visit.   Subjective Assessment - 09/28/20 1405    Subjective  I think it is better- don't bother me as much - now I don't try and push up on it -but getting there    Pertinent History Pain started in July or Aug  -when she did something in kitching and felt sharp pain - it got better thru the months - had few session with PT - but pain not better- still linger about 3/10 in wrist - more dorsal and radial - was refer to OT/hand therapy    Patient Stated Goals Want the pain better in my L wrist so I can use it to push up , twist and turn things, pull or squeeze    Currently in Pain? Yes    Pain Score 1     Pain Location Wrist    Pain Orientation Left    Pain Descriptors / Indicators Aching    Pain Type Acute pain;Chronic pain    Pain Onset More than a month ago    Pain Frequency Intermittent    Aggravating Factors  end range of wrist flexion                       Pt AROM WNL -and pain at end range over dorsal ulnar wrist last 5-10 degrees of flexion  1-2/10 and no pain with extention Tenderness over TFCC also better 1-2/10  Pt cont wearing splint with activities that cause pain  Done some graston tool nr 2 sweeping over volar  and radial wrist and forearm prior to ionto  SKin check done prior - no issues - tolerate well - can wear patch for about hour afterwards    OT Treatments/Exercises (OP) - 09/28/20 0001      Iontophoresis   Type of Iontophoresis Dexamethasone    Location [facility]    Dose med patch , 1.6 current    Time 25                  OT Education - 09/28/20 1406    Education Details ionto use and splint wearing    Person(s) Educated Patient    Methods Explanation;Demonstration;Tactile cues;Verbal cues;Handout    Comprehension Verbal cues required;Returned demonstration;Verbalized understanding               OT Long Term Goals - 09/16/20 1234      OT LONG TERM GOAL #1   Title L wrist AROM increase to WNL with no increase symptoms    Baseline pain or strain for wrist flexion, extention end range 3/10 - AROM WNL pain about 1-2/10 last 5-10 degrees    Time 3    Period Weeks    Status On-going    Target Date 09/26/20      OT LONG TERM GOAL #2    Title L grip and prehension strength to be WNL without symptoms to grip, twist , squeez and pull objects    Baseline pain 3/10  with wrist sup , weight bearing , twisting, or squeezing- NOW pain/tenderness decrease 1-2/10    Time 4    Period Weeks    Status On-going    Target Date 10/10/20      OT LONG TERM GOAL #3   Title Pain improve for pt to wean out of splints be able to grip , pull , pushup    Baseline Need splint still -pain 3/10 or more without any splint- NOW splint still needed - pain decrease 1-2/10    Time 4    Period Weeks    Status On-going    Target Date 10/10/20                 Plan - 09/28/20 1407    Clinical Impression Statement Pt report pain now between 0-2/10 - with extention better - end range flexion stil feel light pull -and tenderness 1-2/10- 7th session of ionto with dexamethazone this date    OT Occupational Profile and History Problem Focused Assessment - Including review of records relating to presenting problem    Occupational performance deficits (Please refer to evaluation for details): ADL's;IADL's;Play;Leisure;Social Participation    Body Structure / Function / Physical Skills ADL;IADL;Pain;Strength;Edema;Flexibility;ROM;UE functional use    Rehab Potential Fair    Clinical Decision Making Limited treatment options, no task modification necessary    Comorbidities Affecting Occupational Performance: None    Modification or Assistance to Complete Evaluation  No modification of tasks or assist necessary to complete eval    OT Frequency 2x / week    OT Duration 2 weeks    OT Treatment/Interventions Self-care/ADL training;Ultrasound;Contrast Bath;Fluidtherapy;Paraffin;Iontophoresis;Therapeutic exercise;Manual Therapy;Splinting;Patient/family education    Consulted and Agree with Plan of Care Patient           Patient will benefit from skilled therapeutic intervention in order to improve the following deficits and impairments:   Body Structure /  Function / Physical Skills: ADL,IADL,Pain,Strength,Edema,Flexibility,ROM,UE functional use       Visit Diagnosis: Stiffness of left wrist, not elsewhere classified  Pain in left  wrist    Problem List Patient Active Problem List   Diagnosis Date Noted  . Dyspnea on exertion 03/07/2017  . Chest pain 03/07/2017  . Rotator cuff tendinitis, right 05/20/2015  . Spondylosis of cervical region without myelopathy or radiculopathy 05/20/2015  . Rotator cuff impingement syndrome of right shoulder 02/05/2014    Rosalyn Gess OTR/L,CLT 09/28/2020, 2:10 PM  [facility] [address] [place], [address] Phone: [phone]   Fax:  [phone]  Name: Kelly Keith MRN: [id number] Date of Birth: Apr 12, 1946

## 2020-09-30 ENCOUNTER — Other Ambulatory Visit: Payer: Self-pay

## 2020-09-30 ENCOUNTER — Ambulatory Visit: Payer: Medicare Other | Admitting: Occupational Therapy

## 2020-09-30 DIAGNOSIS — M25632 Stiffness of left wrist, not elsewhere classified: Secondary | ICD-10-CM

## 2020-09-30 DIAGNOSIS — M25532 Pain in left wrist: Secondary | ICD-10-CM | POA: Diagnosis not present

## 2020-09-30 NOTE — Therapy (Signed)
[facility] [address], [place], [address] Phone: [phone]   Fax:  [phone]  Occupational Therapy Treatment  Patient Details  Name: Kelly Keith MRN: [id number] Date of Birth: [birth year] Referring Provider (OT): Cassell Smiles   Encounter Date: 09/30/2020   OT End of Session - 09/30/20 1239    Visit Number 14    Number of Visits 15    Date for OT Re-Evaluation 10/10/20    OT Start Time 1138    OT Stop Time 1208    OT Time Calculation (min) 30 min    Activity Tolerance Patient tolerated treatment well    Behavior During Therapy [facility] for tasks assessed/performed           Past Medical History:  Diagnosis Date  . Arthritis   . Cancer ([place]) 2016   left lower eye lid  . GERD (gastroesophageal reflux disease)    occas  . Motion sickness    cars  . Osteoporosis   . Shoulder pain, right   . Vertigo    in past    Past Surgical History:  Procedure Laterality Date  . ABDOMINAL HYSTERECTOMY    . AMPUTATION TOE Right 1980's   right 2nd toe due to infection  . BROW LIFT Bilateral 08/30/2015   Procedure: BLEPHAROPLASTY;  Surgeon: Karle Starch, MD;  Location: [place];  Service: Ophthalmology;  Laterality: Bilateral;  . ECTROPION REPAIR Right 08/30/2015   Procedure: REPAIR OF ECTROPION;  Surgeon: Karle Starch, MD;  Location: [place];  Service: Ophthalmology;  Laterality: Right;  . EYE SURGERY    . FOOT SURGERY    . PTOSIS REPAIR Bilateral 08/30/2015   Procedure: PTOSIS REPAIR;  Surgeon: Karle Starch, MD;  Location: [place];  Service: Ophthalmology;  Laterality: Bilateral;  eyelids  . TONSILLECTOMY AND ADENOIDECTOMY      There were no vitals filed for this visit.   Subjective Assessment - 09/30/20 1238    Subjective  Better - but about same as earlier this week - still try and avoid pushing up    Pertinent History Pain started in July or Aug -when she did something in  kitching and felt sharp pain - it got better thru the months - had few session with PT - but pain not better- still linger about 3/10 in wrist - more dorsal and radial - was refer to OT/hand therapy    Patient Stated Goals Want the pain better in my L wrist so I can use it to push up , twist and turn things, pull or squeeze    Currently in Pain? Yes    Pain Score 1     Pain Location Wrist    Pain Orientation Left    Pain Descriptors / Indicators --   pull   Pain Type Acute pain;Chronic pain    Pain Onset More than a month ago    Pain Frequency Intermittent    Aggravating Factors  end range exention - slight pull in end flexion              Pt AROM WNL -and pain at end range over dorsal ulnar wrist last 5-10 degrees of flexion  1-2/10 and no pain with extention Tenderness over TFCC alsobetter 1-2/10  Pt cont wearing splint with activities that cause pain Done some graston tool nr 2 sweeping over volar and radial wrist and forearm prior to ionto  SKin check done  prior - no issues - tolerate well - patch removed this date - some medication was seeping out            OT Treatments/Exercises (OP) - 09/30/20 0001      Iontophoresis   Type of Iontophoresis Dexamethasone    Location [facility]    Dose med patch , 1.6 current    Time 25                  OT Education - 09/30/20 1239    Education Details ionto use and splint wearing    Person(s) Educated Patient    Comprehension Verbal cues required;Returned demonstration;Verbalized understanding               OT Long Term Goals - 09/16/20 1234      OT LONG TERM GOAL #1   Title L wrist AROM increase to WNL with no increase symptoms    Baseline pain or strain for wrist flexion, extention end range 3/10 - AROM WNL pain about 1-2/10 last 5-10 degrees    Time 3    Period Weeks    Status On-going    Target Date 09/26/20      OT LONG TERM GOAL #2   Title L grip and prehension strength to be WNL without  symptoms to grip, twist , squeez and pull objects    Baseline pain 3/10  with wrist sup , weight bearing , twisting, or squeezing- NOW pain/tenderness decrease 1-2/10    Time 4    Period Weeks    Status On-going    Target Date 10/10/20      OT LONG TERM GOAL #3   Title Pain improve for pt to wean out of splints be able to grip , pull , pushup    Baseline Need splint still -pain 3/10 or more without any splint- NOW splint still needed - pain decrease 1-2/10    Time 4    Period Weeks    Status On-going    Target Date 10/10/20                 Plan - 09/30/20 1240    Clinical Impression Statement Pt pain now 0-2/10 - pull with extreme flexion at 80 degrees and strain /pull with extreme extention - tenderrness about 1-2/10 - pt decrease next week to 1/10 - 8th session of ionto this date - pt to still avoid pushing up or extremene flexion with resistance position    OT Occupational Profile and History Problem Focused Assessment - Including review of records relating to presenting problem    Occupational performance deficits (Please refer to evaluation for details): ADL's;IADL's;Play;Leisure;Social Participation    Body Structure / Function / Physical Skills ADL;IADL;Pain;Strength;Edema;Flexibility;ROM;UE functional use    Rehab Potential Fair    Clinical Decision Making Limited treatment options, no task modification necessary    Comorbidities Affecting Occupational Performance: None    Modification or Assistance to Complete Evaluation  No modification of tasks or assist necessary to complete eval    OT Frequency 1x / week    OT Duration 2 weeks    OT Treatment/Interventions Self-care/ADL training;Ultrasound;Contrast Bath;Fluidtherapy;Paraffin;Iontophoresis;Therapeutic exercise;Manual Therapy;Splinting;Patient/family education    Consulted and Agree with Plan of Care Patient           Patient will benefit from skilled therapeutic intervention in order to improve the following  deficits and impairments:   Body Structure / Function / Physical Skills: ADL,IADL,Pain,Strength,Edema,Flexibility,ROM,UE functional use       Visit  Diagnosis: Stiffness of left wrist, not elsewhere classified  Pain in left wrist    Problem List Patient Active Problem List   Diagnosis Date Noted  . Dyspnea on exertion 03/07/2017  . Chest pain 03/07/2017  . Rotator cuff tendinitis, right 05/20/2015  . Spondylosis of cervical region without myelopathy or radiculopathy 05/20/2015  . Rotator cuff impingement syndrome of right shoulder 02/05/2014    Rosalyn Gess OTR/l,CLT 09/30/2020, 12:42 PM  [facility] [address] [place], [address] Phone: [phone]   Fax:  [phone]  Name: Kelly Keith MRN: [id number] Date of Birth: 30-Jul-1945

## 2020-10-05 ENCOUNTER — Ambulatory Visit: Payer: Medicare Other | Admitting: Occupational Therapy

## 2020-10-05 ENCOUNTER — Other Ambulatory Visit: Payer: Self-pay

## 2020-10-05 DIAGNOSIS — M25632 Stiffness of left wrist, not elsewhere classified: Secondary | ICD-10-CM

## 2020-10-05 DIAGNOSIS — M25532 Pain in left wrist: Secondary | ICD-10-CM | POA: Diagnosis not present

## 2020-10-05 NOTE — Therapy (Deleted)
[facility] [address] [place], [address] Phone: [phone]   Fax:  [phone]  Occupational Therapy Treatment  Patient Details  Name: Kelly Keith MRN: [id number] Date of Birth: 07-31-1945 Referring Provider (OT): Cassell Smiles   Encounter Date: 10/05/2020   OT End of Session - 10/05/20 1557    Visit Number 15    Number of Visits 15    Date for OT Re-Evaluation 10/10/20    OT Start Time 8527    OT Stop Time 1430    OT Time Calculation (min) 42 min    Activity Tolerance Patient tolerated treatment well    Behavior During Therapy [facility] for tasks assessed/performed           Past Medical History:  Diagnosis Date  . Arthritis   . Cancer ([place]) 2016   left lower eye lid  . GERD (gastroesophageal reflux disease)    occas  . Motion sickness    cars  . Osteoporosis   . Shoulder pain, right   . Vertigo    in past    Past Surgical History:  Procedure Laterality Date  . ABDOMINAL HYSTERECTOMY    . AMPUTATION TOE Right 1980's   right 2nd toe due to infection  . BROW LIFT Bilateral 08/30/2015   Procedure: BLEPHAROPLASTY;  Surgeon: Karle Starch, MD;  Location: [place];  Service: Ophthalmology;  Laterality: Bilateral;  . ECTROPION REPAIR Right 08/30/2015   Procedure: REPAIR OF ECTROPION;  Surgeon: Karle Starch, MD;  Location: [place];  Service: Ophthalmology;  Laterality: Right;  . EYE SURGERY    . FOOT SURGERY    . PTOSIS REPAIR Bilateral 08/30/2015   Procedure: PTOSIS REPAIR;  Surgeon: Karle Starch, MD;  Location: [place];  Service: Ophthalmology;  Laterality: Bilateral;  eyelids  . TONSILLECTOMY AND ADENOIDECTOMY      There were no vitals filed for this visit.   Subjective Assessment - 10/05/20 1535    Subjective  Pain still decrease to 0-1/10 - weight bearing and end range bending of wrist still feel slight pull    Pertinent History Pain started in July or Aug  -when she did something in kitching and felt sharp pain - it got better thru the months - had few session with PT - but pain not better- still linger about 3/10 in wrist - more dorsal and radial - was refer to OT/hand therapy    Patient Stated Goals Want the pain better in my L wrist so I can use it to push up , twist and turn things, pull or squeeze    Currently in Pain? Yes    Pain Score 1     Pain Location Wrist    Pain Orientation Left    Pain Descriptors / Indicators --   light pull                        OT Treatments/Exercises (OP) - 10/05/20 0001      Iontophoresis   Type of Iontophoresis Dexamethasone    Location [facility]    Dose med patch , 1.5 current    Time 26                  OT Education - 10/05/20 1557    Education Details ionto use and splint wearing, HEP    Person(s) Educated Patient    Methods Explanation;Demonstration;Tactile cues;Verbal cues;Handout  Comprehension Verbal cues required;Returned demonstration;Verbalized understanding               OT Long Term Goals - 09/16/20 1234      OT LONG TERM GOAL #1   Title L wrist AROM increase to WNL with no increase symptoms    Baseline pain or strain for wrist flexion, extention end range 3/10 - AROM WNL pain about 1-2/10 last 5-10 degrees    Time 3    Period Weeks    Status On-going    Target Date 09/26/20      OT LONG TERM GOAL #2   Title L grip and prehension strength to be WNL without symptoms to grip, twist , squeez and pull objects    Baseline pain 3/10  with wrist sup , weight bearing , twisting, or squeezing- NOW pain/tenderness decrease 1-2/10    Time 4    Period Weeks    Status On-going    Target Date 10/10/20      OT LONG TERM GOAL #3   Title Pain improve for pt to wean out of splints be able to grip , pull , pushup    Baseline Need splint still -pain 3/10 or more without any splint- NOW splint still needed - pain decrease 1-2/10    Time 4    Period Weeks     Status On-going    Target Date 10/10/20                 Plan - 10/05/20 1559    Clinical Impression Statement Pt report 0-1/10 light pull or stretch when pushing thru palm or flexion end range - tenderness proximal to ulnar styloid but 1/10- AROM WNL pt has 80 degrees of wrist ext, flexion 100 degrees- -grip and prehension WNL compare to R - pt to cont at home and follow up in 2wks for possible discharge    OT Occupational Profile and History Problem Focused Assessment - Including review of records relating to presenting problem    Occupational performance deficits (Please refer to evaluation for details): ADL's;IADL's;Play;Leisure;Social Participation    Body Structure / Function / Physical Skills ADL;IADL;Pain;Strength;Edema;Flexibility;ROM;UE functional use    Rehab Potential Fair    Clinical Decision Making Limited treatment options, no task modification necessary    Comorbidities Affecting Occupational Performance: None    Modification or Assistance to Complete Evaluation  No modification of tasks or assist necessary to complete eval    OT Frequency 1x / week    OT Duration 2 weeks    OT Treatment/Interventions Self-care/ADL training;Ultrasound;Contrast Bath;Fluidtherapy;Paraffin;Iontophoresis;Therapeutic exercise;Manual Therapy;Splinting;Patient/family education    Consulted and Agree with Plan of Care Patient           Patient will benefit from skilled therapeutic intervention in order to improve the following deficits and impairments:   Body Structure / Function / Physical Skills: ADL,IADL,Pain,Strength,Edema,Flexibility,ROM,UE functional use       Visit Diagnosis: Stiffness of left wrist, not elsewhere classified  Pain in left wrist    Problem List Patient Active Problem List   Diagnosis Date Noted  . Dyspnea on exertion 03/07/2017  . Chest pain 03/07/2017  . Rotator cuff tendinitis, right 05/20/2015  . Spondylosis of cervical region without myelopathy or  radiculopathy 05/20/2015  . Rotator cuff impingement syndrome of right shoulder 02/05/2014    Rosalyn Gess 10/05/2020, 4:03 PM  [facility] [address] [place], [address] Phone: [phone]   Fax:  [phone]  Name: FINNLEY LEWIS MRN: [id number] Date of Birth: [birth year]

## 2020-10-05 NOTE — Therapy (Signed)
[place] [facility] [address], [place], [address] Phone: [phone]   Fax:  [phone]  Occupational Therapy Treatment  Patient Details  Name: Kelly Keith MRN: [id number] Date of Birth: 12/07/1945 Referring Provider (OT): Cassell Smiles   Encounter Date: 10/05/2020   OT End of Session - 10/05/20 1557    Visit Number 15    Number of Visits 15    Date for OT Re-Evaluation 10/10/20    OT Start Time 3559    OT Stop Time 1430    OT Time Calculation (min) 42 min    Activity Tolerance Patient tolerated treatment well    Behavior During Therapy [facility] for tasks assessed/performed           Past Medical History:  Diagnosis Date  . Arthritis   . Cancer ([place]) 2016   left lower eye lid  . GERD (gastroesophageal reflux disease)    occas  . Motion sickness    cars  . Osteoporosis   . Shoulder pain, right   . Vertigo    in past    Past Surgical History:  Procedure Laterality Date  . ABDOMINAL HYSTERECTOMY    . AMPUTATION TOE Right 1980's   right 2nd toe due to infection  . BROW LIFT Bilateral 08/30/2015   Procedure: BLEPHAROPLASTY;  Surgeon: Karle Starch, MD;  Location: [place];  Service: Ophthalmology;  Laterality: Bilateral;  . ECTROPION REPAIR Right 08/30/2015   Procedure: REPAIR OF ECTROPION;  Surgeon: Karle Starch, MD;  Location: [place];  Service: Ophthalmology;  Laterality: Right;  . EYE SURGERY    . FOOT SURGERY    . PTOSIS REPAIR Bilateral 08/30/2015   Procedure: PTOSIS REPAIR;  Surgeon: Karle Starch, MD;  Location: [place];  Service: Ophthalmology;  Laterality: Bilateral;  eyelids  . TONSILLECTOMY AND ADENOIDECTOMY      There were no vitals filed for this visit.   Subjective Assessment - 10/05/20 1535    Subjective  Pain still decrease to 0-1/10 - weight bearing and end range bending of wrist still feel slight pull    Pertinent History Pain started in July or Aug  -when she did something in kitching and felt sharp pain - it got better thru the months - had few session with PT - but pain not better- still linger about 3/10 in wrist - more dorsal and radial - was refer to OT/hand therapy    Patient Stated Goals Want the pain better in my L wrist so I can use it to push up , twist and turn things, pull or squeeze    Currently in Pain? Yes    Pain Score 1     Pain Location Wrist    Pain Orientation Left    Pain Descriptors / Indicators --   light pull             [facility] OT Assessment - 10/05/20 0001      AROM   Left Wrist Extension 80 Degrees    Left Wrist Flexion 100 Degrees    Left Wrist Radial Deviation 20 Degrees    Left Wrist Ulnar Deviation 30 Degrees      Strength   Right Hand Grip (lbs) 50    Right Hand Lateral Pinch 14 lbs    Right Hand 3 Point Pinch 10 lbs    Left Hand Grip (lbs) 48    Left Hand Lateral Pinch 12 lbs  Left Hand 3 Point Pinch 12 lbs            Pt AROM WNL - wrist flexion 100, extention 100  Pt only some pull or pain less than 1/10 in the last 5-10 degrees and weight bearing into extention  Tenderness over TFCC alsobetter1/10 Pt cont wearing splint with activities that cause pain  SKin check done prior - no issues - tolerate well -pt to keep med patch on for hour  Will follow up in 2 wks - possible discharge           OT Treatments/Exercises (OP) - 10/05/20 0001      Iontophoresis   Type of Iontophoresis Dexamethasone    Location [facility]    Dose med patch , 1.5 current    Time 26                  OT Education - 10/05/20 1557    Education Details ionto use and splint wearing, HEP    Person(s) Educated Patient    Methods Explanation;Demonstration;Tactile cues;Verbal cues;Handout    Comprehension Verbal cues required;Returned demonstration;Verbalized understanding               OT Long Term Goals - 09/16/20 1234      OT LONG TERM GOAL #1   Title L wrist AROM increase to WNL  with no increase symptoms    Baseline pain or strain for wrist flexion, extention end range 3/10 - AROM WNL pain about 1-2/10 last 5-10 degrees    Time 3    Period Weeks    Status On-going    Target Date 09/26/20      OT LONG TERM GOAL #2   Title L grip and prehension strength to be WNL without symptoms to grip, twist , squeez and pull objects    Baseline pain 3/10  with wrist sup , weight bearing , twisting, or squeezing- NOW pain/tenderness decrease 1-2/10    Time 4    Period Weeks    Status On-going    Target Date 10/10/20      OT LONG TERM GOAL #3   Title Pain improve for pt to wean out of splints be able to grip , pull , pushup    Baseline Need splint still -pain 3/10 or more without any splint- NOW splint still needed - pain decrease 1-2/10    Time 4    Period Weeks    Status On-going    Target Date 10/10/20                 Plan - 10/05/20 1559    Clinical Impression Statement Pt report 0-1/10 light pull or stretch when pushing thru palm or flexion end range - tenderness proximal to ulnar styloid but 1/10- AROM WNL pt has 80 degrees of wrist ext, flexion 100 degrees- -grip and prehension WNL compare to R - pt to cont at home and follow up in 2wks for possible discharge    OT Occupational Profile and History Problem Focused Assessment - Including review of records relating to presenting problem    Occupational performance deficits (Please refer to evaluation for details): ADL's;IADL's;Play;Leisure;Social Participation    Body Structure / Function / Physical Skills ADL;IADL;Pain;Strength;Edema;Flexibility;ROM;UE functional use    Rehab Potential Fair    Clinical Decision Making Limited treatment options, no task modification necessary    Comorbidities Affecting Occupational Performance: None    Modification or Assistance to Complete Evaluation  No modification of tasks or assist necessary  to complete eval    OT Frequency 1x / week    OT Duration 2 weeks    OT  Treatment/Interventions Self-care/ADL training;Ultrasound;Contrast Bath;Fluidtherapy;Paraffin;Iontophoresis;Therapeutic exercise;Manual Therapy;Splinting;Patient/family education    Consulted and Agree with Plan of Care Patient           Patient will benefit from skilled therapeutic intervention in order to improve the following deficits and impairments:   Body Structure / Function / Physical Skills: ADL,IADL,Pain,Strength,Edema,Flexibility,ROM,UE functional use       Visit Diagnosis: Stiffness of left wrist, not elsewhere classified  Pain in left wrist    Problem List Patient Active Problem List   Diagnosis Date Noted  . Dyspnea on exertion 03/07/2017  . Chest pain 03/07/2017  . Rotator cuff tendinitis, right 05/20/2015  . Spondylosis of cervical region without myelopathy or radiculopathy 05/20/2015  . Rotator cuff impingement syndrome of right shoulder 02/05/2014    Rosalyn Gess OTR/L,CLT 10/05/2020, 4:07 PM  [place] [facility] [address] [place], [address] Phone: [phone]   Fax:  [phone]  Name: MARICARMEN BRAZIEL MRN: [id number] Date of Birth: 08/25/1945

## 2020-10-14 DIAGNOSIS — H02052 Trichiasis without entropian right lower eyelid: Secondary | ICD-10-CM | POA: Diagnosis not present

## 2020-10-14 DIAGNOSIS — H2511 Age-related nuclear cataract, right eye: Secondary | ICD-10-CM | POA: Diagnosis not present

## 2020-10-19 ENCOUNTER — Ambulatory Visit: Payer: Medicare Other | Admitting: Occupational Therapy

## 2020-10-19 ENCOUNTER — Other Ambulatory Visit: Payer: Self-pay

## 2020-10-19 DIAGNOSIS — M25532 Pain in left wrist: Secondary | ICD-10-CM

## 2020-10-19 DIAGNOSIS — M25632 Stiffness of left wrist, not elsewhere classified: Secondary | ICD-10-CM | POA: Diagnosis not present

## 2020-10-19 NOTE — Therapy (Signed)
[place] [facility] [address] [place], [address] Phone: [phone]   Fax:  [phone]  Occupational Therapy Treatment/dicharge  Patient Details  Name: Kelly Keith MRN: [id number] Date of Birth: 10-Jan-1946 Referring Provider (OT): Cassell Smiles   Encounter Date: 10/19/2020   OT End of Session - 10/19/20 1426    Visit Number 16    Number of Visits 16    Date for OT Re-Evaluation 10/19/20    OT Start Time 1402    OT Stop Time 1417    OT Time Calculation (min) 15 min    Activity Tolerance Patient tolerated treatment well    Behavior During Therapy [facility] for tasks assessed/performed           Past Medical History:  Diagnosis Date  . Arthritis   . Cancer ([place]) 2016   left lower eye lid  . GERD (gastroesophageal reflux disease)    occas  . Motion sickness    cars  . Osteoporosis   . Shoulder pain, right   . Vertigo    in past    Past Surgical History:  Procedure Laterality Date  . ABDOMINAL HYSTERECTOMY    . AMPUTATION TOE Right 1980's   right 2nd toe due to infection  . BROW LIFT Bilateral 08/30/2015   Procedure: BLEPHAROPLASTY;  Surgeon: Karle Starch, MD;  Location: [place];  Service: Ophthalmology;  Laterality: Bilateral;  . ECTROPION REPAIR Right 08/30/2015   Procedure: REPAIR OF ECTROPION;  Surgeon: Karle Starch, MD;  Location: [place];  Service: Ophthalmology;  Laterality: Right;  . EYE SURGERY    . FOOT SURGERY    . PTOSIS REPAIR Bilateral 08/30/2015   Procedure: PTOSIS REPAIR;  Surgeon: Karle Starch, MD;  Location: [place];  Service: Ophthalmology;  Laterality: Bilateral;  eyelids  . TONSILLECTOMY AND ADENOIDECTOMY      There were no vitals filed for this visit.   Subjective Assessment - 10/19/20 1424    Subjective  Doing ok - did not get the worse the last 2 wks- pain still at the most 1/10 - maybe 1-2 x day - like twisting or pushing up - but otherwise good     Pertinent History Pain started in July or Aug -when she did something in kitching and felt sharp pain - it got better thru the months - had few session with PT - but pain not better- still linger about 3/10 in wrist - more dorsal and radial - was refer to OT/hand therapy    Patient Stated Goals Want the pain better in my L wrist so I can use it to push up , twist and turn things, pull or squeeze    Currently in Pain? No/denies              [facility] OT Assessment - 10/19/20 0001      AROM   Left Wrist Extension 90 Degrees   1/10 dorsal wrist   Left Wrist Flexion 100 Degrees   1/10 dorsal   Left Wrist Radial Deviation 20 Degrees    Left Wrist Ulnar Deviation 30 Degrees      Strength   Right Hand Grip (lbs) 50    Right Hand Lateral Pinch 14 lbs    Right Hand 3 Point Pinch 10 lbs    Left Hand Grip (lbs) 50    Left Hand Lateral Pinch 12 lbs    Left Hand 3 Point Pinch 12 lbs  Pt AROM WNL - wrist flexion 100, extention 90    Pt only some pull less than 1/10 in the last 5-10 degrees and with weight bearing into extention  Tenderness with joint mobs to DRUJ-  Pt report maybe feeling it 1-2 x day - but stay about 0-1/10  Pt cont wearing neoprene Benik wrist wrapt with activities that cause painor feels she needs it  Contact me or Dr Candelaria Stagers if pain increase                  OT Education - 10/19/20 1425    Education Details Avoid pushing up , extreme flexion or twisting    Person(s) Educated Patient    Methods Explanation;Demonstration;Tactile cues;Verbal cues;Handout    Comprehension Verbal cues required;Returned demonstration;Verbalized understanding               OT Long Term Goals - 10/19/20 1434      OT LONG TERM GOAL #1   Title L wrist AROM increase to WNL with no increase symptoms    Baseline pain or strain for wrist flexion, extention end range 3/10 -  L wrist AROM WNL -1/10 at end range flexion , ext    Status Partially Met      OT LONG  TERM GOAL #2   Title L grip and prehension strength to be WNL without symptoms to grip, twist , squeez and pull objects    Baseline pain 3/10  with wrist sup , weight bearing , twisting, or squeezing-  NOW grip and prehension WNL and no pain - only with end range flexion, ext 1/10    Status Achieved      OT LONG TERM GOAL #3   Title Pain improve for pt to wean out of splints be able to grip , pull , pushup    Baseline Need splint still -pain 3/10 or more without any splint- NOW  now splint - pain only end range flexion , ext 1/10    Status Achieved                 Plan - 10/19/20 1430    Clinical Impression Statement Pt's pain was 3/10 in all motion of L wrist , and grip/prehension - but now only end range flexion at 100 degrees, ext at 90 - and 1/10 - but feel it maybe 1-2 x day - with pushing up or twisting - but other wise pain decrease , AROM and grip/prehension improve with grip no pain - increase functional use with less pain - [place] did show some Minimal enlargement of the dorsal wrist recess likely represents degenerative change - pt discharge at this time- and can contact me or Dr Candelaria Stagers if pain increase or increase issues with L wrist    OT Occupational Profile and History Problem Focused Assessment - Including review of records relating to presenting problem    Occupational performance deficits (Please refer to evaluation for details): ADL's;IADL's;Play;Leisure;Social Participation    Body Structure / Function / Physical Skills ADL;IADL;Pain;Strength;Edema;Flexibility;ROM;UE functional use    Rehab Potential Fair    Clinical Decision Making Limited treatment options, no task modification necessary    Comorbidities Affecting Occupational Performance: None    Modification or Assistance to Complete Evaluation  No modification of tasks or assist necessary to complete eval    OT Treatment/Interventions Self-care/ADL training;Ultrasound;Contrast  Bath;Fluidtherapy;Paraffin;Iontophoresis;Therapeutic exercise;Manual Therapy;Splinting;Patient/family education    Consulted and Agree with Plan of Care Patient  Patient will benefit from skilled therapeutic intervention in order to improve the following deficits and impairments:   Body Structure / Function / Physical Skills: ADL,IADL,Pain,Strength,Edema,Flexibility,ROM,UE functional use       Visit Diagnosis: Stiffness of left wrist, not elsewhere classified - Plan: Ot plan of care cert/re-cert  Pain in left wrist - Plan: Ot plan of care cert/re-cert    Problem List Patient Active Problem List   Diagnosis Date Noted  . Dyspnea on exertion 03/07/2017  . Chest pain 03/07/2017  . Rotator cuff tendinitis, right 05/20/2015  . Spondylosis of cervical region without myelopathy or radiculopathy 05/20/2015  . Rotator cuff impingement syndrome of right shoulder 02/05/2014    Rosalyn Gess OTR/L,CLT 10/19/2020, 2:37 PM  [place] [facility] [address] [place], [address] Phone: [phone]   Fax:  [phone]  Name: Kelly Keith MRN: [id number] Date of Birth: 1945-11-15

## 2020-10-26 ENCOUNTER — Encounter: Payer: Self-pay | Admitting: Emergency Medicine

## 2020-10-26 ENCOUNTER — Ambulatory Visit
Admission: EM | Admit: 2020-10-26 | Discharge: 2020-10-26 | Disposition: A | Discharge status: Home / Self Care | Payer: Medicare Other | Attending: Family Medicine | Admitting: Family Medicine

## 2020-10-26 ENCOUNTER — Other Ambulatory Visit: Payer: Self-pay

## 2020-10-26 DIAGNOSIS — N3090 Cystitis, unspecified without hematuria: Secondary | ICD-10-CM

## 2020-10-26 MED ORDER — CEPHALEXIN 500 MG PO CAPS: 500.0000 mg | ORAL_CAPSULE | Freq: Two times a day (BID) | ORAL | 0 refills | Status: AC

## 2020-10-26 NOTE — ED Provider Notes (Signed)
Kelly Keith    CSN: [id number] Arrival date & time: 10/26/20  1211      History   Chief Complaint Chief Complaint  Patient presents with  . Urinary Frequency  . Dysuria    HPI Kelly Keith is a 75 y.o. female.   HPI  Patient presents with dysuria and urine frequency earlier today. History of recurrent UTI with most recent 2/22 for which her culture grew E. Coli.  She was treated with Ceftin and symptoms resolved until today.  Since being here in the clinic today she is only to produce a scant amount of urine however continues to feel the urge and pressure of needing to urinate.  Denies any fever, abdominal pain or low back pain.  Past Medical History:  Diagnosis Date  . Arthritis   . Cancer ([place]) 2016   left lower eye lid  . GERD (gastroesophageal reflux disease)    occas  . Motion sickness    cars  . Osteoporosis   . Shoulder pain, right   . Vertigo    in past    Patient Active Problem List   Diagnosis Date Noted  . Dyspnea on exertion 03/07/2017  . Chest pain 03/07/2017  . Rotator cuff tendinitis, right 05/20/2015  . Spondylosis of cervical region without myelopathy or radiculopathy 05/20/2015  . Rotator cuff impingement syndrome of right shoulder 02/05/2014    Past Surgical History:  Procedure Laterality Date  . ABDOMINAL HYSTERECTOMY    . AMPUTATION TOE Right 1980's   right 2nd toe due to infection  . BROW LIFT Bilateral 08/30/2015   Procedure: BLEPHAROPLASTY;  Surgeon: Karle Starch, MD;  Location: [place];  Service: Ophthalmology;  Laterality: Bilateral;  . ECTROPION REPAIR Right 08/30/2015   Procedure: REPAIR OF ECTROPION;  Surgeon: Karle Starch, MD;  Location: [place];  Service: Ophthalmology;  Laterality: Right;  . EYE SURGERY    . FOOT SURGERY    . PTOSIS REPAIR Bilateral 08/30/2015   Procedure: PTOSIS REPAIR;  Surgeon: Karle Starch, MD;  Location: [place];  Service: Ophthalmology;  Laterality: Bilateral;   eyelids  . TONSILLECTOMY AND ADENOIDECTOMY      OB History    Gravida  1   Para  1   Term      Preterm      AB      Living  1     SAB      IAB      Ectopic      Multiple      Live Births               Home Medications    Prior to Admission medications   Medication Sig Start Date End Date Taking? Authorizing Provider  cephALEXin (KEFLEX) 500 MG capsule Take 1 capsule (500 mg total) by mouth 2 (two) times daily for 10 days. 10/26/20 11/05/20 Yes Scot Jun, FNP  Citrus Bergamot 250 MG/0.25GM POWD 500 mg.  01/21/20   [provider]  clobetasol cream (TEMOVATE) 9.20 % Apply 1 application topically 2 (two) times daily.    [provider]  Coenzyme Q10 (COQ10 PO) Take by mouth daily.    [provider]  COVID-19 Ad26 vaccine, JANSSEN/J&J, 0.5 ML injection USE AS DIRECTED 06/14/20 06/14/21  Carlyle Basques, MD  dextromethorphan-guaiFENesin (ROBITUSSIN-DM) 10-100 MG/5ML liquid Take by mouth every 4 (four) hours as needed for cough.    [provider]  fexofenadine (ALLEGRA) 180 MG  tablet Take by mouth daily.     [provider]  GARLIC PO Take by mouth daily.    [provider]  Ginger, Zingiber officinalis, (GINGER PO) Take by mouth daily.    [provider]  ketoconazole (NIZORAL) 2 % cream Apply 1 application topically daily as needed.  06/26/16   [provider]  Lutein 6 MG CAPS Take by mouth.    [provider]  Milk Thistle Extract 87.5 MG CAPS Take by mouth.    [provider]  Multiple Vitamin (MULTIVITAMIN) tablet Take 1 tablet by mouth daily.    [provider]  Nutritional Supplements (CARDIO COMPLETE PO) Take by mouth daily.    [provider]  OVER THE COUNTER MEDICATION daily. Joint advantage    [provider]  OVER THE COUNTER MEDICATION 2 (two) times daily. True osteo (bone health)    [provider]  Papaya CHEW Chew by mouth  2 (two) times daily.    [provider]  Probiotic Product (PROBIOTIC DAILY PO) Take by mouth daily.    [provider]  rosuvastatin (CRESTOR) 5 MG tablet Take 5 mg by mouth daily.    [provider]  Turmeric POWD Take by mouth daily.     [provider]    Family History Family History  Problem Relation Age of Onset  . Heart disease Mother   . Arthritis Mother   . Anxiety disorder Mother   . Osteoporosis Mother   . Lung cancer Father   . Congestive Heart Failure Father   . High blood pressure Father     Social History Social History   Tobacco Use  . Smoking status: Never Smoker  . Smokeless tobacco: Never Used  Vaping Use  . Vaping Use: Never used  Substance Use Topics  . Alcohol use: No    Alcohol/week: 0.0 standard drinks  . Drug use: Never     Allergies   Septra [sulfamethoxazole-trimethoprim]   Review of Systems Review of Systems Pertinent negatives listed in HPI  Physical Exam Triage Vital Signs ED Triage Vitals  Enc Vitals Group     BP 10/26/20 1232 (!) 160/82     Pulse Rate 10/26/20 1232 (!) 119     Resp 10/26/20 1232 20     Temp 10/26/20 1232 99.2 F (37.3 C)     Temp Source 10/26/20 1232 Oral     SpO2 10/26/20 1232 98 %     Weight --      Height --      Head Circumference --      Peak Flow --      Pain Score 10/26/20 1252 0     Pain Loc --      Pain Edu? --      Excl. in [place]? --    No data found.  Updated Vital Signs BP (!) 160/82 (BP Location: Left Arm)   Pulse (!) 119   Temp 99.2 F (37.3 C) (Oral)   Resp 20   LMP  (LMP Unknown)   SpO2 98%   Visual Acuity Right Eye Distance:   Left Eye Distance:   Bilateral Distance:    Right Eye Near:   Left Eye Near:    Bilateral Near:     Physical Exam General appearance: alert, well developed, well nourished, cooperative and in no distress Head: Normocephalic, without obvious abnormality, atraumatic Respiratory: Respirations even and unlabored,  normal respiratory rate Heart: rate and rhythm normal. No gallop  or murmurs noted on exam  Abdomen: BS +, no distention, no rebound tenderness, or no mass Extremities: No gross deformities Skin: Skin color, texture, turgor normal. No rashes seen  Psych: Appropriate mood and affect. Neurologic: GCS 15, normal coordination , normal gait   UC Treatments / Results  Labs (all labs ordered are listed, but only abnormal results are displayed) Labs Reviewed - No data to display  EKG   Radiology No results found.  Procedures Procedures (including critical care time)  Medications Ordered in UC Medications - No data to display  Initial Impression / Assessment and Plan / UC Course  I have reviewed the triage vital signs and the nursing notes.  Pertinent labs & imaging results that were available during my care of the patient were reviewed by me and considered in my medical decision making (see chart for details).   Cystitis, recurrent. No specimen after 1.5 hour here in clinic. Treating based on symptoms. Return precaution given.  Final Clinical Impressions(s) / UC Diagnoses   Final diagnoses:  Cystitis   Discharge Instructions   None    ED Prescriptions    Medication Sig Dispense Auth. Provider   cephALEXin (KEFLEX) 500 MG capsule Take 1 capsule (500 mg total) by mouth 2 (two) times daily for 10 days. 20 capsule Scot Jun, FNP     PDMP not reviewed this encounter.   Scot Jun, FNP 10/26/20 1415

## 2020-10-26 NOTE — ED Triage Notes (Addendum)
Patient c/o urinary frequency and dysuria  that started today.    Patient denies any hematuria, foul odor, and ABD pain.   Patient endorses " a pain when starting up to pee".   Patient denies abnormal vaginal discharge.   Patient hasn't taken any medications today for symptoms.

## 2020-11-02 ENCOUNTER — Encounter: Payer: Self-pay | Admitting: Podiatry

## 2020-11-02 ENCOUNTER — Other Ambulatory Visit: Payer: Self-pay

## 2020-11-02 ENCOUNTER — Ambulatory Visit (INDEPENDENT_AMBULATORY_CARE_PROVIDER_SITE_OTHER): Payer: Medicare Other

## 2020-11-02 ENCOUNTER — Ambulatory Visit (INDEPENDENT_AMBULATORY_CARE_PROVIDER_SITE_OTHER): Payer: Medicare Other | Admitting: Podiatry

## 2020-11-02 DIAGNOSIS — M722 Plantar fascial fibromatosis: Secondary | ICD-10-CM

## 2020-11-02 DIAGNOSIS — M778 Other enthesopathies, not elsewhere classified: Secondary | ICD-10-CM

## 2020-11-02 MED ORDER — TRIAMCINOLONE ACETONIDE 10 MG/ML IJ SUSP
30.0000 mg | Freq: Once | INTRAMUSCULAR | Status: AC
  Administered 2020-11-02: 30 mg

## 2020-11-02 NOTE — Progress Notes (Signed)
Subjective:   Patient ID: Kelly Keith, female   DOB: 75 y.o.   MRN: [id number]   HPI Patient states she developed significant reoccurrence of pain in the bottom of both heels and also in her right ankle.  States it has been going on now for last few weeks and she did very well for around 5 months   ROS      Objective:  Physical Exam  Neuro vascular status intact exquisite discomfort plantar lateral and exquisite discomfort into the sinus tarsi right with inflammation fluid buildup     Assessment:  Acute fasciitis bilateral sinus tarsitis right     Plan:  H&P reviewed all conditions sterile prep and injected the plantar fascial bilateral 3 mg Kenalog 5 mg Xylocaine and the sinus tarsi right 3 mg Kenalog 5 mg Xylocaine advised on anti-inflammatories physical therapy reappoint as needed  X-rays dated today indicated slight progression of spur formation no indications of acute fracture or other acute pathology

## 2020-11-14 DIAGNOSIS — X32XXXD Exposure to sunlight, subsequent encounter: Secondary | ICD-10-CM | POA: Diagnosis not present

## 2020-11-14 DIAGNOSIS — D225 Melanocytic nevi of trunk: Secondary | ICD-10-CM | POA: Diagnosis not present

## 2020-11-14 DIAGNOSIS — C44729 Squamous cell carcinoma of skin of left lower limb, including hip: Secondary | ICD-10-CM | POA: Diagnosis not present

## 2020-11-14 DIAGNOSIS — L821 Other seborrheic keratosis: Secondary | ICD-10-CM | POA: Diagnosis not present

## 2020-11-14 DIAGNOSIS — Z08 Encounter for follow-up examination after completed treatment for malignant neoplasm: Secondary | ICD-10-CM | POA: Diagnosis not present

## 2020-11-14 DIAGNOSIS — Z85828 Personal history of other malignant neoplasm of skin: Secondary | ICD-10-CM | POA: Diagnosis not present

## 2020-11-14 DIAGNOSIS — L570 Actinic keratosis: Secondary | ICD-10-CM | POA: Diagnosis not present

## 2020-12-12 DIAGNOSIS — Z85828 Personal history of other malignant neoplasm of skin: Secondary | ICD-10-CM | POA: Diagnosis not present

## 2020-12-12 DIAGNOSIS — B351 Tinea unguium: Secondary | ICD-10-CM | POA: Diagnosis not present

## 2020-12-12 DIAGNOSIS — Z08 Encounter for follow-up examination after completed treatment for malignant neoplasm: Secondary | ICD-10-CM | POA: Diagnosis not present

## 2020-12-12 DIAGNOSIS — B078 Other viral warts: Secondary | ICD-10-CM | POA: Diagnosis not present

## 2020-12-12 DIAGNOSIS — L820 Inflamed seborrheic keratosis: Secondary | ICD-10-CM | POA: Diagnosis not present

## 2020-12-14 DIAGNOSIS — H02052 Trichiasis without entropian right lower eyelid: Secondary | ICD-10-CM | POA: Diagnosis not present

## 2021-01-17 DIAGNOSIS — N183 Chronic kidney disease, stage 3 unspecified: Secondary | ICD-10-CM | POA: Diagnosis not present

## 2021-01-17 DIAGNOSIS — Z1331 Encounter for screening for depression: Secondary | ICD-10-CM | POA: Diagnosis not present

## 2021-01-17 DIAGNOSIS — Z1211 Encounter for screening for malignant neoplasm of colon: Secondary | ICD-10-CM | POA: Diagnosis not present

## 2021-01-17 DIAGNOSIS — Z89421 Acquired absence of other right toe(s): Secondary | ICD-10-CM | POA: Diagnosis not present

## 2021-01-17 DIAGNOSIS — E785 Hyperlipidemia, unspecified: Secondary | ICD-10-CM | POA: Diagnosis not present

## 2021-01-17 DIAGNOSIS — Z0000 Encounter for general adult medical examination without abnormal findings: Secondary | ICD-10-CM | POA: Diagnosis not present

## 2021-01-17 DIAGNOSIS — M810 Age-related osteoporosis without current pathological fracture: Secondary | ICD-10-CM | POA: Diagnosis not present

## 2021-02-06 DIAGNOSIS — M5412 Radiculopathy, cervical region: Secondary | ICD-10-CM | POA: Diagnosis not present

## 2021-02-06 DIAGNOSIS — M62830 Muscle spasm of back: Secondary | ICD-10-CM | POA: Diagnosis not present

## 2021-02-06 DIAGNOSIS — M5030 Other cervical disc degeneration, unspecified cervical region: Secondary | ICD-10-CM | POA: Diagnosis not present

## 2021-02-20 DIAGNOSIS — N390 Urinary tract infection, site not specified: Secondary | ICD-10-CM | POA: Diagnosis not present

## 2021-02-20 DIAGNOSIS — R300 Dysuria: Secondary | ICD-10-CM | POA: Diagnosis not present

## 2021-02-22 DIAGNOSIS — H02052 Trichiasis without entropian right lower eyelid: Secondary | ICD-10-CM | POA: Diagnosis not present

## 2021-03-03 DIAGNOSIS — R300 Dysuria: Secondary | ICD-10-CM | POA: Diagnosis not present

## 2021-03-03 DIAGNOSIS — N390 Urinary tract infection, site not specified: Secondary | ICD-10-CM | POA: Diagnosis not present

## 2021-03-13 ENCOUNTER — Other Ambulatory Visit: Payer: Self-pay | Admitting: Family Medicine

## 2021-03-13 DIAGNOSIS — M5412 Radiculopathy, cervical region: Secondary | ICD-10-CM

## 2021-03-13 DIAGNOSIS — M5030 Other cervical disc degeneration, unspecified cervical region: Secondary | ICD-10-CM | POA: Diagnosis not present

## 2021-03-13 DIAGNOSIS — M5134 Other intervertebral disc degeneration, thoracic region: Secondary | ICD-10-CM | POA: Diagnosis not present

## 2021-03-20 DIAGNOSIS — L820 Inflamed seborrheic keratosis: Secondary | ICD-10-CM | POA: Diagnosis not present

## 2021-03-20 DIAGNOSIS — I8310 Varicose veins of unspecified lower extremity with inflammation: Secondary | ICD-10-CM | POA: Diagnosis not present

## 2021-03-21 ENCOUNTER — Ambulatory Visit
Admission: RE | Admit: 2021-03-21 | Discharge: 2021-03-21 | Disposition: A | Discharge status: Home / Self Care | Payer: Medicare Other | Source: Ambulatory Visit | Attending: Family Medicine | Admitting: Family Medicine

## 2021-03-21 ENCOUNTER — Other Ambulatory Visit: Payer: Self-pay

## 2021-03-21 DIAGNOSIS — M5412 Radiculopathy, cervical region: Secondary | ICD-10-CM | POA: Insufficient documentation

## 2021-03-21 DIAGNOSIS — M542 Cervicalgia: Secondary | ICD-10-CM | POA: Diagnosis not present

## 2021-03-27 DIAGNOSIS — Z23 Encounter for immunization: Secondary | ICD-10-CM | POA: Diagnosis not present

## 2021-03-29 DIAGNOSIS — M5414 Radiculopathy, thoracic region: Secondary | ICD-10-CM | POA: Diagnosis not present

## 2021-03-29 DIAGNOSIS — M5124 Other intervertebral disc displacement, thoracic region: Secondary | ICD-10-CM | POA: Diagnosis not present

## 2021-04-07 ENCOUNTER — Telehealth (INDEPENDENT_AMBULATORY_CARE_PROVIDER_SITE_OTHER): Payer: Self-pay | Admitting: Nurse Practitioner

## 2021-04-07 NOTE — Telephone Encounter (Signed)
Called stating that she was seen 07/2019 with FB and would like to have a f/u  with studies. Please advise.

## 2021-04-07 NOTE — Telephone Encounter (Signed)
Is she having worsening symptoms? What are her concerns, to make sure we are the best provider to see her.  If it is in regards to her veins, but shes not having any worsening symptoms, I visit with studies may not be useful.  If it is in regards to the bakers cysts that we found, she needs to follow up with her PCP as we don't treat baker's cysts

## 2021-04-07 NOTE — Telephone Encounter (Signed)
Patient states that her vv are swollen behind left knee. Patient states she doesn't believe its coming from the bakers cyst.  I made patient aware of NP's note

## 2021-04-10 ENCOUNTER — Other Ambulatory Visit: Payer: Self-pay

## 2021-04-10 ENCOUNTER — Ambulatory Visit (INDEPENDENT_AMBULATORY_CARE_PROVIDER_SITE_OTHER): Payer: Medicare Other | Admitting: Podiatry

## 2021-04-10 DIAGNOSIS — M778 Other enthesopathies, not elsewhere classified: Secondary | ICD-10-CM | POA: Diagnosis not present

## 2021-04-10 DIAGNOSIS — M7752 Other enthesopathy of left foot: Secondary | ICD-10-CM

## 2021-04-10 MED ORDER — TRIAMCINOLONE ACETONIDE 10 MG/ML IJ SUSP
20.0000 mg | Freq: Once | INTRAMUSCULAR | Status: AC
  Administered 2021-04-10: 20 mg

## 2021-04-12 NOTE — Progress Notes (Signed)
Subjective:   Patient ID: Kelly Keith, female   DOB: 75 y.o.   MRN: [id number]   HPI Patient presents stating she is getting pain back in her ankle again right over left stating that both are sore and she did have improvement for at least 4-1/2 months   ROS      Objective:  Physical Exam  Neurovascular status intact with inflammation pain of the sinus tarsi right over left with fluid buildup     Assessment:  Inflammatory capsulitis of the sinus tarsi bilateral     Plan:  H&P reviewed condition sterile prep injected the sinus tarsi capsule 3 mg Kenalog 5 mg Xylocaine right and left after sterile prep and sterile dressings applied.  Reappoint to recheck

## 2021-04-14 DIAGNOSIS — Z23 Encounter for immunization: Secondary | ICD-10-CM | POA: Diagnosis not present

## 2021-04-19 DIAGNOSIS — M5030 Other cervical disc degeneration, unspecified cervical region: Secondary | ICD-10-CM | POA: Diagnosis not present

## 2021-04-19 DIAGNOSIS — M5134 Other intervertebral disc degeneration, thoracic region: Secondary | ICD-10-CM | POA: Diagnosis not present

## 2021-04-19 DIAGNOSIS — M545 Low back pain, unspecified: Secondary | ICD-10-CM | POA: Diagnosis not present

## 2021-04-19 DIAGNOSIS — M5441 Lumbago with sciatica, right side: Secondary | ICD-10-CM | POA: Diagnosis not present

## 2021-04-27 ENCOUNTER — Other Ambulatory Visit: Payer: Self-pay | Admitting: Family Medicine

## 2021-04-27 DIAGNOSIS — M5416 Radiculopathy, lumbar region: Secondary | ICD-10-CM

## 2021-04-28 DIAGNOSIS — Z1211 Encounter for screening for malignant neoplasm of colon: Secondary | ICD-10-CM | POA: Diagnosis not present

## 2021-05-05 ENCOUNTER — Ambulatory Visit
Admission: RE | Admit: 2021-05-05 | Discharge: 2021-05-05 | Disposition: A | Discharge status: Home / Self Care | Payer: Medicare Other | Source: Ambulatory Visit | Attending: Family Medicine | Admitting: Family Medicine

## 2021-05-05 ENCOUNTER — Other Ambulatory Visit: Payer: Self-pay

## 2021-05-05 DIAGNOSIS — M5126 Other intervertebral disc displacement, lumbar region: Secondary | ICD-10-CM | POA: Diagnosis not present

## 2021-05-05 DIAGNOSIS — M5416 Radiculopathy, lumbar region: Secondary | ICD-10-CM | POA: Insufficient documentation

## 2021-05-05 DIAGNOSIS — M5136 Other intervertebral disc degeneration, lumbar region: Secondary | ICD-10-CM | POA: Diagnosis not present

## 2021-05-05 DIAGNOSIS — M48061 Spinal stenosis, lumbar region without neurogenic claudication: Secondary | ICD-10-CM | POA: Diagnosis not present

## 2021-05-08 DIAGNOSIS — H02052 Trichiasis without entropian right lower eyelid: Secondary | ICD-10-CM | POA: Diagnosis not present

## 2021-05-23 DIAGNOSIS — M5442 Lumbago with sciatica, left side: Secondary | ICD-10-CM | POA: Diagnosis not present

## 2021-05-23 DIAGNOSIS — M5414 Radiculopathy, thoracic region: Secondary | ICD-10-CM | POA: Diagnosis not present

## 2021-05-23 DIAGNOSIS — M7061 Trochanteric bursitis, right hip: Secondary | ICD-10-CM | POA: Diagnosis not present

## 2021-05-23 DIAGNOSIS — M5441 Lumbago with sciatica, right side: Secondary | ICD-10-CM | POA: Diagnosis not present

## 2021-05-23 DIAGNOSIS — G8929 Other chronic pain: Secondary | ICD-10-CM | POA: Diagnosis not present

## 2021-05-23 DIAGNOSIS — M5134 Other intervertebral disc degeneration, thoracic region: Secondary | ICD-10-CM | POA: Diagnosis not present

## 2021-05-31 DIAGNOSIS — Z87440 Personal history of urinary (tract) infections: Secondary | ICD-10-CM | POA: Diagnosis not present

## 2021-05-31 DIAGNOSIS — R300 Dysuria: Secondary | ICD-10-CM | POA: Diagnosis not present

## 2021-05-31 DIAGNOSIS — N390 Urinary tract infection, site not specified: Secondary | ICD-10-CM | POA: Diagnosis not present

## 2021-06-19 DIAGNOSIS — M25561 Pain in right knee: Secondary | ICD-10-CM | POA: Diagnosis not present

## 2021-06-19 DIAGNOSIS — M7122 Synovial cyst of popliteal space [Baker], left knee: Secondary | ICD-10-CM | POA: Diagnosis not present

## 2021-06-19 DIAGNOSIS — M25562 Pain in left knee: Secondary | ICD-10-CM | POA: Diagnosis not present

## 2021-06-26 DIAGNOSIS — L820 Inflamed seborrheic keratosis: Secondary | ICD-10-CM | POA: Diagnosis not present

## 2021-06-26 DIAGNOSIS — L821 Other seborrheic keratosis: Secondary | ICD-10-CM | POA: Diagnosis not present

## 2021-08-03 ENCOUNTER — Ambulatory Visit (INDEPENDENT_AMBULATORY_CARE_PROVIDER_SITE_OTHER): Payer: Medicare Other | Admitting: Podiatry

## 2021-08-03 ENCOUNTER — Encounter: Payer: Self-pay | Admitting: Podiatry

## 2021-08-03 ENCOUNTER — Other Ambulatory Visit: Payer: Self-pay

## 2021-08-03 DIAGNOSIS — L600 Ingrowing nail: Secondary | ICD-10-CM | POA: Diagnosis not present

## 2021-08-03 NOTE — Progress Notes (Signed)
Subjective:   Patient ID: Kelly Keith, female   DOB: 76 y.o.   MRN: [id number]   HPI Patient presents with severely deformed painful hallux nails both feet that she cannot cut and they are bothersome for her and she would like them removed   ROS      Objective:  Physical Exam  Neurovascular status intact with thickened damaged nailbeds hallux bilateral painful when pressed with shoe gear hard to wear     Assessment:  Chronic lesion formation of the hallux bilateral with pain     Plan:  H&P reviewed recommended nail removal patient wants surgery and I infiltrated each hallux 60 mg like Marcaine mixture sterile prep done and using sterile instrumentation remove the hallux nails bilateral exposed matrix applied phenol for applications 30 seconds followed by alcohol lavage sterile dressing gave instructions on soaks and leave dressing on 24-hour to take them off earlier if any throbbing were to occur.  Patient is encouraged to call questions concerns

## 2021-08-03 NOTE — Patient Instructions (Signed)

## 2021-08-25 ENCOUNTER — Encounter: Payer: Self-pay | Admitting: Obstetrics & Gynecology

## 2021-08-29 ENCOUNTER — Other Ambulatory Visit: Payer: Self-pay

## 2021-08-29 ENCOUNTER — Encounter: Payer: Self-pay | Admitting: Emergency Medicine

## 2021-08-29 ENCOUNTER — Ambulatory Visit
Admission: EM | Admit: 2021-08-29 | Discharge: 2021-08-29 | Disposition: A | Discharge status: Home / Self Care | Payer: Medicare Other | Attending: Emergency Medicine | Admitting: Emergency Medicine

## 2021-08-29 DIAGNOSIS — R300 Dysuria: Secondary | ICD-10-CM | POA: Insufficient documentation

## 2021-08-29 DIAGNOSIS — R350 Frequency of micturition: Secondary | ICD-10-CM | POA: Diagnosis present

## 2021-08-29 LAB — POCT URINALYSIS DIP (MANUAL ENTRY)
Bilirubin, UA: NEGATIVE
Glucose, UA: NEGATIVE mg/dL
Ketones, POC UA: NEGATIVE mg/dL
Nitrite, UA: NEGATIVE
Protein Ur, POC: 100 mg/dL — AB
Spec Grav, UA: 1.02 (ref 1.010–1.025)
Urobilinogen, UA: 0.2 E.U./dL
pH, UA: 7 (ref 5.0–8.0)

## 2021-08-29 MED ORDER — CEPHALEXIN 500 MG PO CAPS: 500.0000 mg | ORAL_CAPSULE | Freq: Two times a day (BID) | ORAL | 0 refills | Status: AC

## 2021-08-29 NOTE — Discharge Instructions (Addendum)
Take the antibiotic as directed.  The urine culture is pending.  We will call you if it shows the need to change or discontinue your antibiotic.    Follow up with your primary care provider.    

## 2021-08-29 NOTE — ED Provider Notes (Signed)
Kelly Keith    CSN: [id number] Arrival date & time: 08/29/21  7829      History   Chief Complaint Chief Complaint  Patient presents with   Dysuria    HPI Kelly Keith is a 76 y.o. female.  Patient presents with dysuria, urinary frequency, urinary urgency, bladder pressure since last night.  She denies hematuria, fever, chills, abdominal pain, flank pain, pelvic pain, or other symptoms.  No treatment at home.  Patient was diagnosed with a UTI on 07/28/2021; treated with amoxicillin then with Macrobid.    The history is provided by the patient and medical records.   Past Medical History:  Diagnosis Date   Arthritis    Cancer ([place]) 2016   left lower eye lid   GERD (gastroesophageal reflux disease)    occas   Motion sickness    cars   Osteoporosis    Shoulder pain, right    Vertigo    in past    Patient Active Problem List   Diagnosis Date Noted   Dyspnea on exertion 03/07/2017   Chest pain 03/07/2017   Rotator cuff tendinitis, right 05/20/2015   Spondylosis of cervical region without myelopathy or radiculopathy 05/20/2015   Rotator cuff impingement syndrome of right shoulder 02/05/2014    Past Surgical History:  Procedure Laterality Date   ABDOMINAL HYSTERECTOMY     AMPUTATION TOE Right 1980's   right 2nd toe due to infection   BROW LIFT Bilateral 08/30/2015   Procedure: BLEPHAROPLASTY;  Surgeon: Karle Starch, MD;  Location: [facility];  Service: Ophthalmology;  Laterality: Bilateral;   ECTROPION REPAIR Right 08/30/2015   Procedure: REPAIR OF ECTROPION;  Surgeon: Karle Starch, MD;  Location: [facility];  Service: Ophthalmology;  Laterality: Right;   EYE SURGERY     FOOT SURGERY     PTOSIS REPAIR Bilateral 08/30/2015   Procedure: PTOSIS REPAIR;  Surgeon: Karle Starch, MD;  Location: [facility];  Service: Ophthalmology;  Laterality: Bilateral;  eyelids   TONSILLECTOMY AND ADENOIDECTOMY      OB History     Gravida  1   Para  1    Term      Preterm      AB      Living  1      SAB      IAB      Ectopic      Multiple      Live Births               Home Medications    Prior to Admission medications   Medication Sig Start Date End Date Taking? Authorizing Provider  cephALEXin (KEFLEX) 500 MG capsule Take 1 capsule (500 mg total) by mouth 2 (two) times daily for 5 days. 08/29/21 09/03/21 Yes Sharion Balloon, NP  Citrus Bergamot 250 MG/0.25GM POWD 500 mg.  01/21/20   [provider]  clobetasol cream (TEMOVATE) 5.62 % Apply 1 application topically 2 (two) times daily.    [provider]  Coenzyme Q10 (COQ10 PO) Take by mouth daily.    [provider]  dextromethorphan-guaiFENesin (ROBITUSSIN-DM) 10-100 MG/5ML liquid Take by mouth every 4 (four) hours as needed for cough.    [provider]  fexofenadine (ALLEGRA) 180 MG tablet Take by mouth daily.     [provider]  GARLIC PO Take by mouth daily.    [provider]  Ginger, Zingiber officinalis, (GINGER PO) Take by mouth daily.  [provider]  ketoconazole (NIZORAL) 2 % cream Apply 1 application topically daily as needed.  06/26/16   [provider]  Lutein 6 MG CAPS Take by mouth.    [provider]  Milk Thistle Extract 87.5 MG CAPS Take by mouth.    [provider]  Multiple Vitamin (MULTIVITAMIN) tablet Take 1 tablet by mouth daily.    [provider]  Nutritional Supplements (CARDIO COMPLETE PO) Take by mouth daily.    [provider]  OVER THE COUNTER MEDICATION daily. Joint advantage    [provider]  OVER THE COUNTER MEDICATION 2 (two) times daily. True osteo (bone health)    [provider]  Papaya CHEW Chew by mouth 2 (two) times daily.    [provider]  Probiotic Product (PROBIOTIC DAILY PO) Take by mouth daily.    [provider]  rosuvastatin (CRESTOR) 5 MG tablet Take 5 mg by mouth daily.     [provider]  Turmeric POWD Take by mouth daily.     [provider]    Family History Family History  Problem Relation Age of Onset   Heart disease Mother    Arthritis Mother    Anxiety disorder Mother    Osteoporosis Mother    Lung cancer Father    Congestive Heart Failure Father    High blood pressure Father     Social History Social History   Tobacco Use   Smoking status: Never   Smokeless tobacco: Never  Vaping Use   Vaping Use: Never used  Substance Use Topics   Alcohol use: No    Alcohol/week: 0.0 standard drinks   Drug use: Never     Allergies   Septra [sulfamethoxazole-trimethoprim]   Review of Systems Review of Systems  Constitutional:  Negative for chills and fever.  Respiratory:  Negative for shortness of breath.   Gastrointestinal:  Negative for abdominal pain and vomiting.  Genitourinary:  Positive for dysuria, frequency and urgency. Negative for flank pain, hematuria and pelvic pain.  Skin:  Negative for color change and rash.  All other systems reviewed and are negative.   Physical Exam Triage Vital Signs ED Triage Vitals  Enc Vitals Group     BP 08/29/21 0858 120/77     Pulse Rate 08/29/21 0858 100     Resp 08/29/21 0858 18     Temp 08/29/21 0858 98.4 F (36.9 C)     Temp src --      SpO2 08/29/21 0858 98 %     Weight --      Height --      Head Circumference --      Peak Flow --      Pain Score 08/29/21 0911 4     Pain Loc --      Pain Edu? --      Excl. in [place]? --    No data found.  Updated Vital Signs BP 120/77    Pulse 100    Temp 98.4 F (36.9 C)    Resp 18    LMP  (LMP Unknown)    SpO2 98%   Visual Acuity Right Eye Distance:   Left Eye Distance:   Bilateral Distance:    Right Eye Near:   Left Eye Near:    Bilateral Near:     Physical Exam Vitals and nursing note reviewed.  Constitutional:      General: She is not in acute distress.    Appearance:  She is well-developed. She is not  ill-appearing.  HENT:     Mouth/Throat:     Mouth: Mucous membranes are moist.  Cardiovascular:     Rate and Rhythm: Normal rate and regular rhythm.     Heart sounds: Normal heart sounds.  Pulmonary:     Effort: Pulmonary effort is normal. No respiratory distress.     Breath sounds: Normal breath sounds.  Abdominal:     Palpations: Abdomen is soft.     Tenderness: There is no abdominal tenderness. There is no right CVA tenderness, left CVA tenderness, guarding or rebound.  Musculoskeletal:     Cervical back: Neck supple.  Skin:    General: Skin is warm and dry.  Neurological:     Mental Status: She is alert.  Psychiatric:        Mood and Affect: Mood normal.        Behavior: Behavior normal.     UC Treatments / Results  Labs (all labs ordered are listed, but only abnormal results are displayed) Labs Reviewed  POCT URINALYSIS DIP (MANUAL ENTRY) - Abnormal; Notable for the following components:      Result Value   Clarity, UA cloudy (*)    Blood, UA moderate (*)    Protein Ur, POC =100 (*)    Leukocytes, UA Large (3+) (*)    All other components within normal limits  URINE CULTURE    EKG   Radiology No results found.  Procedures Procedures (including critical care time)  Medications Ordered in UC Medications - No data to display  Initial Impression / Assessment and Plan / UC Course  I have reviewed the triage vital signs and the nursing notes.  Pertinent labs & imaging results that were available during my care of the patient were reviewed by me and considered in my medical decision making (see chart for details).   Dysuria, Urinary frequency.  Treating with Keflex. Urine culture pending. Discussed with patient that we will call her if the urine culture shows the need to change or discontinue the antibiotic. Instructed her to follow-up with her PCP if her symptoms are not improving. Patient agrees to plan of care.      Final Clinical Impressions(s) / UC  Diagnoses   Final diagnoses:  Dysuria  Urinary frequency     Discharge Instructions      Take the antibiotic as directed.  The urine culture is pending.  We will call you if it shows the need to change or discontinue your antibiotic.    Follow up with your primary care provider.           ED Prescriptions     Medication Sig Dispense Auth. Provider   cephALEXin (KEFLEX) 500 MG capsule Take 1 capsule (500 mg total) by mouth 2 (two) times daily for 5 days. 10 capsule Sharion Balloon, NP      PDMP not reviewed this encounter.   Sharion Balloon, NP 08/29/21 1006

## 2021-08-29 NOTE — ED Triage Notes (Signed)
Pt here with increased urinary frequency, urgency and pressure since this morning.

## 2021-08-31 LAB — URINE CULTURE: Culture: >=100000 — AB

## 2021-09-09 ENCOUNTER — Ambulatory Visit
Admission: EM | Admit: 2021-09-09 | Discharge: 2021-09-09 | Disposition: A | Discharge status: Home / Self Care | Payer: Medicare Other | Attending: Family Medicine | Admitting: Family Medicine

## 2021-09-09 ENCOUNTER — Encounter: Payer: Self-pay | Admitting: Emergency Medicine

## 2021-09-09 ENCOUNTER — Other Ambulatory Visit: Payer: Self-pay

## 2021-09-09 DIAGNOSIS — R300 Dysuria: Secondary | ICD-10-CM | POA: Insufficient documentation

## 2021-09-09 DIAGNOSIS — N3001 Acute cystitis with hematuria: Secondary | ICD-10-CM

## 2021-09-09 LAB — POCT URINALYSIS DIP (MANUAL ENTRY)
Bilirubin, UA: NEGATIVE
Glucose, UA: 100 mg/dL — AB
Ketones, POC UA: NEGATIVE mg/dL
Nitrite, UA: POSITIVE — AB
Protein Ur, POC: 30 mg/dL — AB
Spec Grav, UA: 1.02 (ref 1.010–1.025)
Urobilinogen, UA: 1 E.U./dL
pH, UA: 7 (ref 5.0–8.0)

## 2021-09-09 MED ORDER — CEPHALEXIN 500 MG PO CAPS: 500.0000 mg | ORAL_CAPSULE | Freq: Two times a day (BID) | ORAL | 0 refills | Status: DC

## 2021-09-09 MED ORDER — CEPHALEXIN 500 MG PO CAPS: 500.0000 mg | ORAL_CAPSULE | Freq: Two times a day (BID) | ORAL | 0 refills | Status: AC

## 2021-09-09 NOTE — Discharge Instructions (Addendum)
Your urine culture will result within 3-5 days. We will notify you via MyChart if any additional treatment is warranted.

## 2021-09-09 NOTE — ED Triage Notes (Signed)
Pt presents with dysuria and urinary urgency started last night.

## 2021-09-09 NOTE — ED Provider Notes (Signed)
[name]    CSN: [id number] Arrival date & time: 09/09/21  0801      History   Chief Complaint Chief Complaint  Patient presents with   Dysuria   urinary urgency     HPI Kelly Keith is a 76 y.o. female.   HPI Kelly Keith is a 76 y.o. female presents for evaluation of urinary frequency, urgency and dysuria x 1 day, without flank pain, fever, chills, or abnormal vaginal discharge or bleeding. Reports she was seen here on 08/29/21 and treated with  5 day course of Keflex. Urine culture on file review per EMR -E.coli sensitive to Keflex. Patient has a history of prolapsed bladder causing urine retention.  She is scheduled to see her gynecologist on Monday.  She has taken Azo for her current symptoms.  Reports symptoms did initially improve with initial prescription of Keflex.   Past Medical History:  Diagnosis Date   Arthritis    Cancer ([place]) 2016   left lower eye lid   GERD (gastroesophageal reflux disease)    occas   Motion sickness    cars   Osteoporosis    Shoulder pain, right    Vertigo    in past    Patient Active Problem List   Diagnosis Date Noted   Dyspnea on exertion 03/07/2017   Chest pain 03/07/2017   Rotator cuff tendinitis, right 05/20/2015   Spondylosis of cervical region without myelopathy or radiculopathy 05/20/2015   Rotator cuff impingement syndrome of right shoulder 02/05/2014    Past Surgical History:  Procedure Laterality Date   ABDOMINAL HYSTERECTOMY     AMPUTATION TOE Right 1980's   right 2nd toe due to infection   BROW LIFT Bilateral 08/30/2015   Procedure: BLEPHAROPLASTY;  Surgeon: Karle Starch, MD;  Location: [facility];  Service: Ophthalmology;  Laterality: Bilateral;   ECTROPION REPAIR Right 08/30/2015   Procedure: REPAIR OF ECTROPION;  Surgeon: Karle Starch, MD;  Location: [facility];  Service: Ophthalmology;  Laterality: Right;   EYE SURGERY     FOOT SURGERY     PTOSIS REPAIR Bilateral 08/30/2015    Procedure: PTOSIS REPAIR;  Surgeon: Karle Starch, MD;  Location: [facility];  Service: Ophthalmology;  Laterality: Bilateral;  eyelids   TONSILLECTOMY AND ADENOIDECTOMY      OB History     Gravida  1   Para  1   Term      Preterm      AB      Living  1      SAB      IAB      Ectopic      Multiple      Live Births               Home Medications    Prior to Admission medications   Medication Sig Start Date End Date Taking? Authorizing Provider  cephALEXin (KEFLEX) 500 MG capsule Take 1 capsule (500 mg total) by mouth 2 (two) times daily for 10 days. 09/09/21 09/19/21  Scot Jun, FNP  Citrus Bergamot 250 MG/0.25GM POWD 500 mg.  01/21/20   [provider]  clobetasol cream (TEMOVATE) 4.01 % Apply 1 application topically 2 (two) times daily.    [provider]  Coenzyme Q10 (COQ10 PO) Take by mouth daily.    [provider]  dextromethorphan-guaiFENesin (ROBITUSSIN-DM) 10-100 MG/5ML liquid Take by mouth every 4 (four) hours as needed for cough.  [provider]  fexofenadine (ALLEGRA) 180 MG tablet Take by mouth daily.     [provider]  GARLIC PO Take by mouth daily.    [provider]  Ginger, Zingiber officinalis, (GINGER PO) Take by mouth daily.    [provider]  ketoconazole (NIZORAL) 2 % cream Apply 1 application topically daily as needed.  06/26/16   [provider]  Lutein 6 MG CAPS Take by mouth.    [provider]  Milk Thistle Extract 87.5 MG CAPS Take by mouth.    [provider]  Multiple Vitamin (MULTIVITAMIN) tablet Take 1 tablet by mouth daily.    [provider]  Nutritional Supplements (CARDIO COMPLETE PO) Take by mouth daily.    [provider]  OVER THE COUNTER MEDICATION daily. Joint advantage    [provider]  OVER THE COUNTER MEDICATION 2 (two) times daily. True osteo (bone health)    [provider]   Papaya CHEW Chew by mouth 2 (two) times daily.    [provider]  Probiotic Product (PROBIOTIC DAILY PO) Take by mouth daily.    [provider]  rosuvastatin (CRESTOR) 5 MG tablet Take 5 mg by mouth daily.    [provider]  Turmeric POWD Take by mouth daily.     [provider]    Family History Family History  Problem Relation Age of Onset   Heart disease Mother    Arthritis Mother    Anxiety disorder Mother    Osteoporosis Mother    Lung cancer Father    Congestive Heart Failure Father    High blood pressure Father     Social History Social History   Tobacco Use   Smoking status: Never   Smokeless tobacco: Never  Vaping Use   Vaping Use: Never used  Substance Use Topics   Alcohol use: No    Alcohol/week: 0.0 standard drinks   Drug use: Never     Allergies   Nsaids, Rosuvastatin calcium, and Septra [sulfamethoxazole-trimethoprim]   Review of Systems Review of Systems   Physical Exam Triage Vital Signs ED Triage Vitals  Enc Vitals Group     BP 09/09/21 0817 136/75     Pulse Rate 09/09/21 0817 82     Resp 09/09/21 0817 16     Temp 09/09/21 0817 98.3 F (36.8 C)     Temp Source 09/09/21 0817 Oral     SpO2 09/09/21 0817 98 %     Weight --      Height --      Head Circumference --      Peak Flow --      Pain Score 09/09/21 0815 0     Pain Loc --      Pain Edu? --      Excl. in [place]? --    No data found.  Updated Vital Signs BP 136/75 (BP Location: Left Arm)    Pulse 82    Temp 98.3 F (36.8 C) (Oral)    Resp 16    LMP  (LMP Unknown)    SpO2 98%   Visual Acuity Right Eye Distance:   Left Eye Distance:   Bilateral Distance:    Right Eye Near:   Left Eye Near:    Bilateral Near:     Physical Exam General appearance: Alert, well developed, well nourished, cooperative Head: Normocephalic, without obvious abnormality, atraumatic Respiratory: Respirations even and unlabored, normal respiratory rate Heart: Rate  and rhythm  normal. No gallop or murmurs noted on exam  Skin: Skin color, texture, turgor normal. No rashes seen  Psych: Appropriate mood and affect. Neurologic: Mental status: Alert, oriented to person, place, and time, thought content appropriate.   UC Treatments / Results  Labs (all labs ordered are listed, but only abnormal results are displayed) Labs Reviewed  POCT URINALYSIS DIP (MANUAL ENTRY) - Abnormal; Notable for the following components:      Result Value   Color, UA orange (*)    Clarity, UA cloudy (*)    Glucose, UA =100 (*)    Blood, UA small (*)    Protein Ur, POC =30 (*)    Nitrite, UA Positive (*)    Leukocytes, UA Large (3+) (*)    All other components within normal limits  URINE CULTURE    EKG   Radiology No results found.  Procedures Procedures (including critical care time)  Medications Ordered in UC Medications - No data to display  Initial Impression / Assessment and Plan / UC Course  I have reviewed the triage vital signs and the nursing notes.  Pertinent labs & imaging results that were available during my care of the patient were reviewed by me and considered in my medical decision making (see chart for details).    Urine culture pending. UA unreliable as patient has taken an AZO. Based on most recent urine culture, will resume Keflex for a total of 10 days, given patient history of recurrent UTI history and age patient is at increased risk of pyelonephritis and urosepsis. Advised to start antibiotics immediately. Discuss with Ob/GYN recurrent UTI to determine if referral to urology is warranted at present. Strict return precautions given .  Final Clinical Impressions(s) / UC Diagnoses   Final diagnoses:  Dysuria  Acute cystitis with hematuria     Discharge Instructions      Your urine culture will result within 3-5 days. We will notify you via MyChart if any additional treatment is warranted.     ED Prescriptions     Medication Sig  Dispense Auth. Provider   cephALEXin (KEFLEX) 500 MG capsule  (Status: Discontinued) Take 1 capsule (500 mg total) by mouth 2 (two) times daily. 10 capsule Scot Jun, FNP   cephALEXin (KEFLEX) 500 MG capsule Take 1 capsule (500 mg total) by mouth 2 (two) times daily for 10 days. 20 capsule Scot Jun, FNP      PDMP not reviewed this encounter.   Scot Jun, [place] 09/09/21 [id number]

## 2021-09-10 LAB — URINE CULTURE
Culture: <10000 — AB
Special Requests: NORMAL

## 2021-09-11 ENCOUNTER — Ambulatory Visit (INDEPENDENT_AMBULATORY_CARE_PROVIDER_SITE_OTHER): Payer: Medicare Other | Admitting: Obstetrics & Gynecology

## 2021-09-11 ENCOUNTER — Encounter: Payer: Self-pay | Admitting: Obstetrics & Gynecology

## 2021-09-11 ENCOUNTER — Other Ambulatory Visit: Payer: Self-pay

## 2021-09-11 VITALS — BP 132/86 | HR 81 | Ht 67.25 in | Wt 162.0 lb

## 2021-09-11 DIAGNOSIS — M810 Age-related osteoporosis without current pathological fracture: Secondary | ICD-10-CM | POA: Diagnosis not present

## 2021-09-11 DIAGNOSIS — Z90710 Acquired absence of both cervix and uterus: Secondary | ICD-10-CM

## 2021-09-11 DIAGNOSIS — Z780 Asymptomatic menopausal state: Secondary | ICD-10-CM

## 2021-09-11 DIAGNOSIS — Z9189 Other specified personal risk factors, not elsewhere classified: Secondary | ICD-10-CM

## 2021-09-11 DIAGNOSIS — Z01419 Encounter for gynecological examination (general) (routine) without abnormal findings: Secondary | ICD-10-CM | POA: Diagnosis not present

## 2021-09-11 DIAGNOSIS — N8110 Cystocele, unspecified: Secondary | ICD-10-CM | POA: Diagnosis not present

## 2021-09-11 NOTE — Progress Notes (Signed)
Kelly Keith 1946/06/19 [id number]   History:    76 y.o. G1P1L1 Married   RP:  Established patient presenting for annual gyn exam and bladder prolapse with frequent UTIs   HPI: Postmenopausal, well on no HRT.  S/P Total Hysterectomy.  No pelvic pain.  Abstinent. Pap Neg in 05/2016.  Breasts normal.  Mammo 08/2021 Neg.  Concerned with bladder prolapse and frequent UTIs, had 5 episodes of bladder infections treated successfully in the course of the year.  The small bladder bulge is not causing pain or discomfort otherwise. BMI 25.37.  BMs normal. Health labs with Dr Rex Kras.  Cologuard 04/2021.  BD 06/2014.  BD in 2015 Lt Forearm Osteoporosis with T-Score at -4.0, Osteopenia at other sites.  Declines repeating a BD as she would not want to start on Bone Medication.  Taking Ca++/Vit D supplements.  Physically active, walking daily.    Past medical history,surgical history, family history and social history were all reviewed and documented in the EPIC chart.  Gynecologic History No LMP recorded (lmp unknown). Patient has had a hysterectomy.  Obstetric History OB History  Gravida Para Term Preterm AB Living  1 1       1   SAB IAB Ectopic Multiple Live Births               # Outcome Date GA Lbr Len/2nd Weight Sex Delivery Anes PTL Lv  1 Para              ROS: A ROS was performed and pertinent positives and negatives are included in the history.  GENERAL: No fevers or chills. HEENT: No change in vision, no earache, sore throat or sinus congestion. NECK: No pain or stiffness. CARDIOVASCULAR: No chest pain or pressure. No palpitations. PULMONARY: No shortness of breath, cough or wheeze. GASTROINTESTINAL: No abdominal pain, nausea, vomiting or diarrhea, melena or bright red blood per rectum. GENITOURINARY: No urinary frequency, urgency, hesitancy or dysuria. MUSCULOSKELETAL: No joint or muscle pain, no back pain, no recent trauma. DERMATOLOGIC: No rash, no itching, no lesions. ENDOCRINE: No  polyuria, polydipsia, no heat or cold intolerance. No recent change in weight. HEMATOLOGICAL: No anemia or easy bruising or bleeding. NEUROLOGIC: No headache, seizures, numbness, tingling or weakness. PSYCHIATRIC: No depression, no loss of interest in normal activity or change in sleep pattern.     Exam:   BP 132/86    Pulse 81    Ht 5' 7.25" (1.708 m)    Wt 162 lb (73.5 kg)    LMP  (LMP Unknown)    SpO2 98%    BMI 25.18 kg/m   Body mass index is 25.18 kg/m.  General appearance : Well developed well nourished female. No acute distress HEENT: Eyes: no retinal hemorrhage or exudates,  Neck supple, trachea midline, no carotid bruits, no thyroidmegaly Lungs: Clear to auscultation, no rhonchi or wheezes, or rib retractions  Heart: Regular rate and rhythm, no murmurs or gallops Breast:Examined in sitting and supine position were symmetrical in appearance, no palpable masses or tenderness,  no skin retraction, no nipple inversion, no nipple discharge, no skin discoloration, no axillary or supraclavicular lymphadenopathy Abdomen: no palpable masses or tenderness, no rebound or guarding Extremities: no edema or skin discoloration or tenderness  Pelvic: Vulva: Normal             Vagina: No gross lesions or discharge.  Cystocele grade 2/4.  No Colpocele.  No Rectocele.  Cervix/Uterus absent  Adnexa  Without masses or tenderness  Anus: Normal   Assessment/Plan:  76 y.o. female for annual exam   1. Well female exam with routine gynecological exam Postmenopausal, well on no HRT.  S/P Total Hysterectomy.  No pelvic pain.  Abstinent. Pap Neg in 05/2016.  Breasts normal.  Mammo 08/2021 Neg.  Concerned with bladder prolapse and frequent UTIs, had 5 episodes of bladder infections treated successfully in the course of the year.  The small bladder bulge is not causing pain or discomfort otherwise. BMI 25.37.  BMs normal. Health labs with Dr Rex Kras.  Cologuard 04/2021. BD in 06/2014 Lt Forearm Osteoporosis  with T-Score at -4.0, Osteopenia at other sites.  Declines repeating a BD as she would not want to start on Bone Medication.  Taking Ca++/Vit D supplements.  Physically active, walking daily.   2. At risk of fracture due to osteoporosis  3. S/P total hysterectomy  4. Baden-Walker grade 2 cystocele Concerned with bladder prolapse and frequent UTIs, had 5 episodes of bladder infections treated successfully in the course of the year.  The small bladder bulge is not causing pain or discomfort otherwise.  Stable Cystocele grade 2/4 on gyn exam.  Will focus on emptying her bladder more completely each time she goes to the bathroom, may benefit from pushing the bladder up vaginally.  5. Postmenopausal Postmenopausal, well on no HRT.  S/P Total Hysterectomy.  No pelvic pain.  Abstinent.   6. Age-related osteoporosis without current pathological fracture BD in 06/2014 Lt Forearm Osteoporosis with T-Score at -4.0, Osteopenia at other sites.  Declines repeating a BD as she would not want to start on Bone Medication.  Taking Ca++/Vit D supplements.  Physically active, walking daily.  Other orders - ezetimibe (ZETIA) 10 MG tablet; Take 1 tablet by mouth daily.   [name] MD, 11:01 AM 09/11/2021

## 2022-01-23 IMAGING — MR MR CERVICAL SPINE W/O CM
5 series · 39 of 48 positions shown · non-contrast
Comparison: None.

CLINICAL DATA: Chronic neck pain radiating into the right shoulder

EXAM:
MRI CERVICAL SPINE WITHOUT CONTRAST
TECHNIQUE: Multiplanar, multisequence MR imaging of the cervical spine was
performed. No intravenous contrast was administered.

[Series 5: T2 · sagittal · 3.0mm · 0.62mm/px · 7 of 15 slices shown (1 of 2)]
[im 1/15]
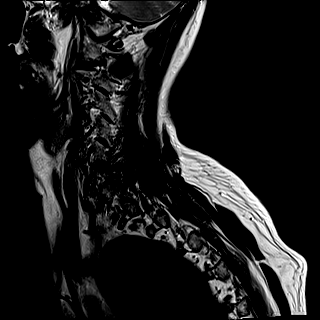
[im 3/15]
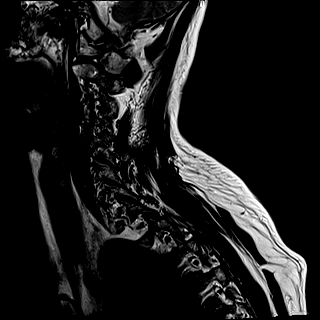
[im 5/15]
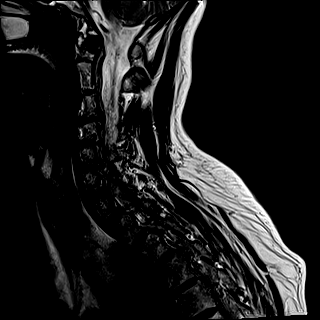
[im 8/15]
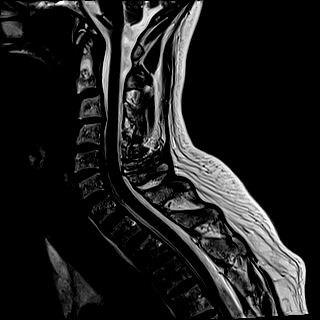
[im 10/15]
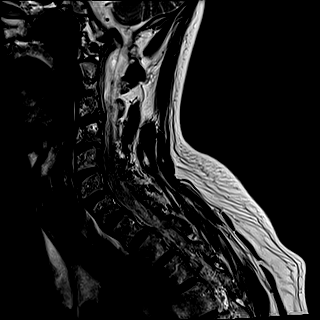
[im 12/15]
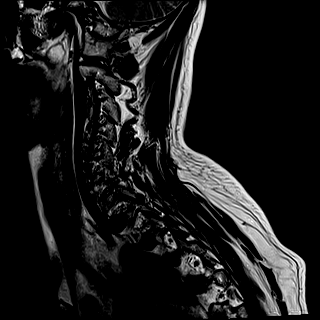
[im 15/15]
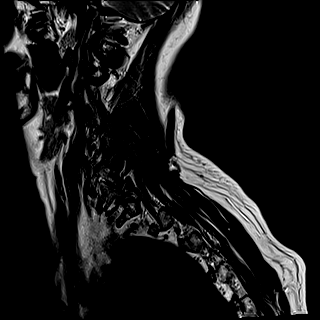

[Series 6: FLAIR · sagittal · 3.0mm · 0.78mm/px · 7 of 15 slices shown]
[im 1/15]
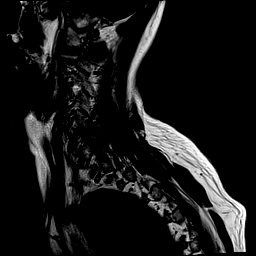
[im 3/15]
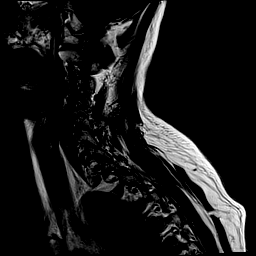
[im 5/15]
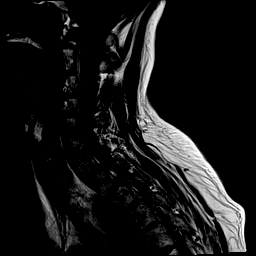
[im 8/15]
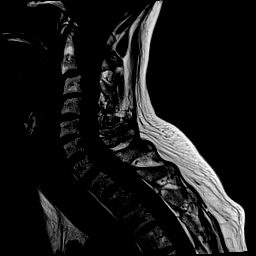
[im 10/15]
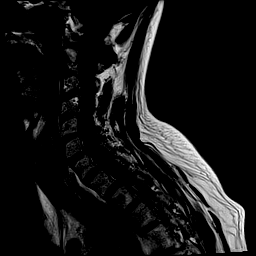
[im 12/15]
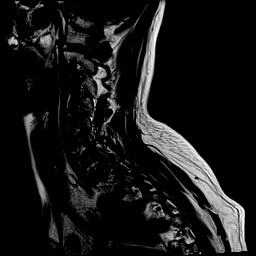
[im 15/15]
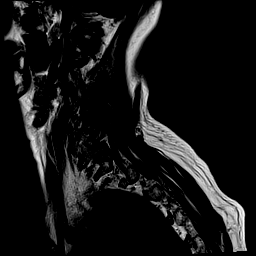

[Series 7: STIR · sagittal · 3.0mm · 0.62mm/px · 7 of 15 slices shown]
[im 1/15]
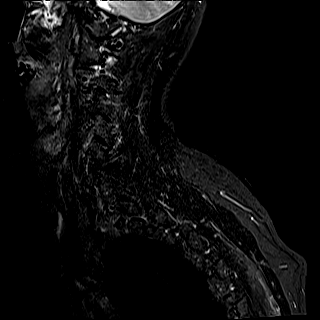
[im 3/15]
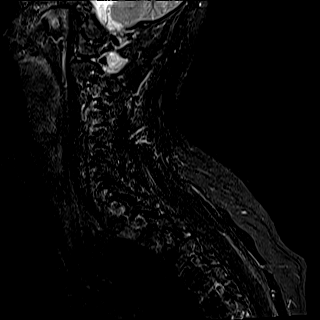
[im 5/15]
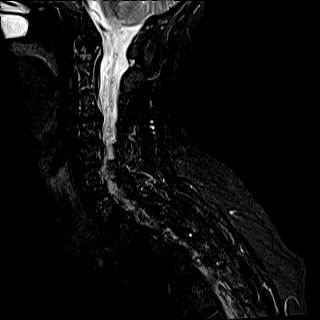
[im 8/15]
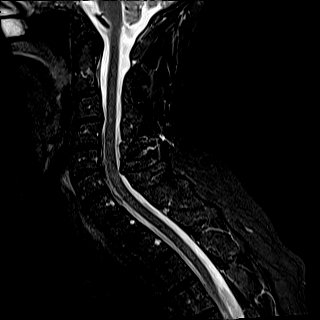
[im 10/15]
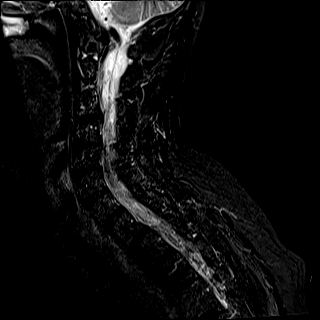
[im 12/15]
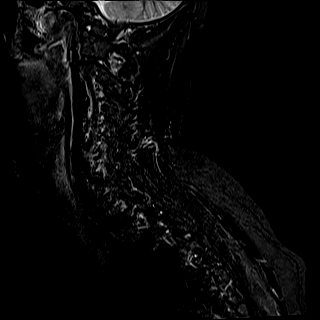
[im 15/15]
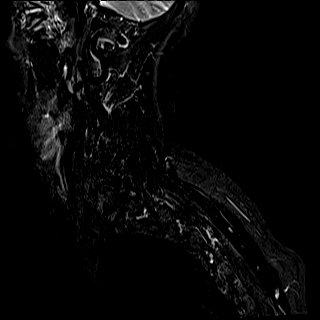

[Series 8: T2 · axial · 3.0mm · 0.70mm/px · z∈[-98,-0]mm · 10 of 27 slices shown (2 of 2)]
[im 1/27]
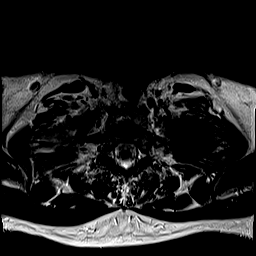
[im 3/27]
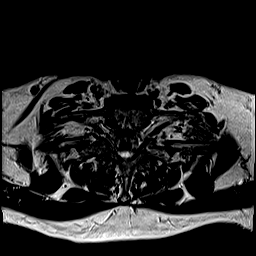
[im 5/27]
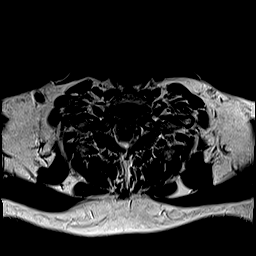
[im 9/27]
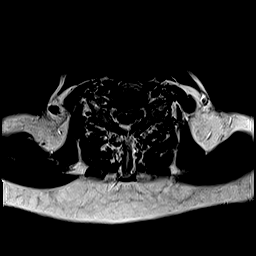
[im 11/27]
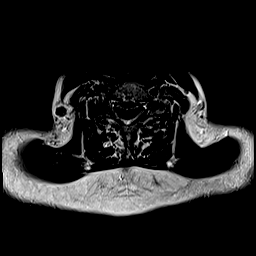
[im 14/27]
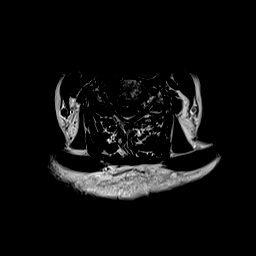
[im 16/27]
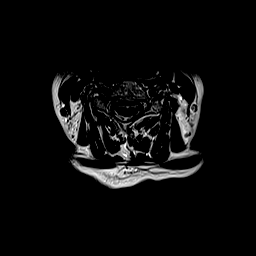
[im 18/27]
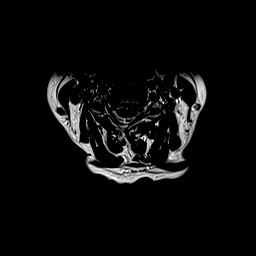
[im 22/27]
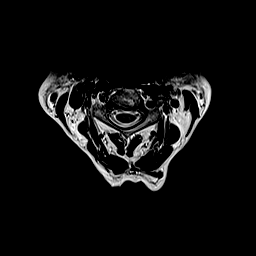
[im 27/27]
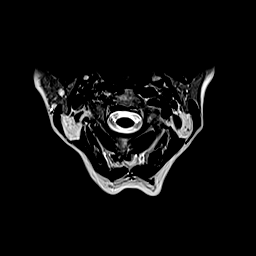

[Series 9: ax mpgr · axial · 3.0mm · 0.35mm/px · z∈[-98,-0]mm · 8 of 29 slices shown]
[im 1/29]
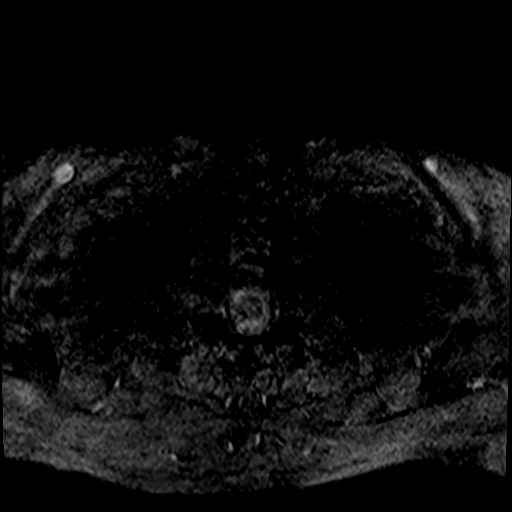
[im 5/29]
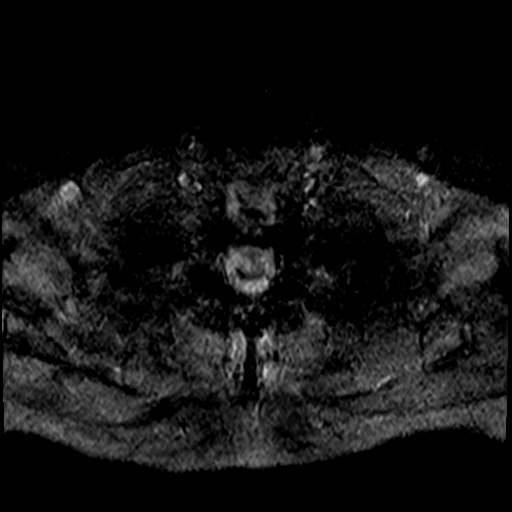
[im 9/29]
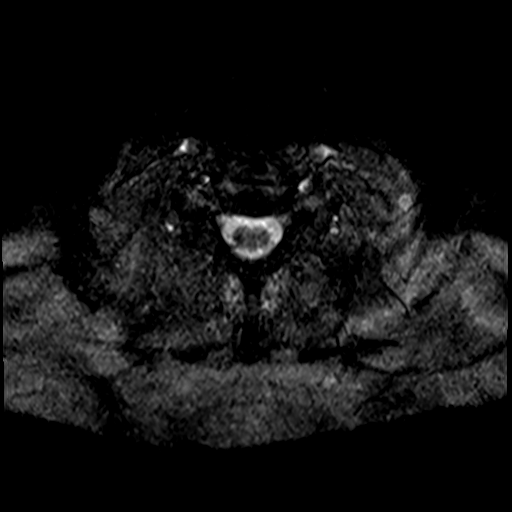
[im 13/29]
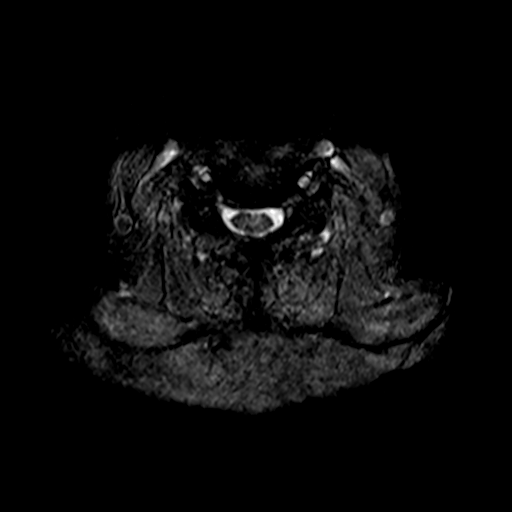
[im 16/29]
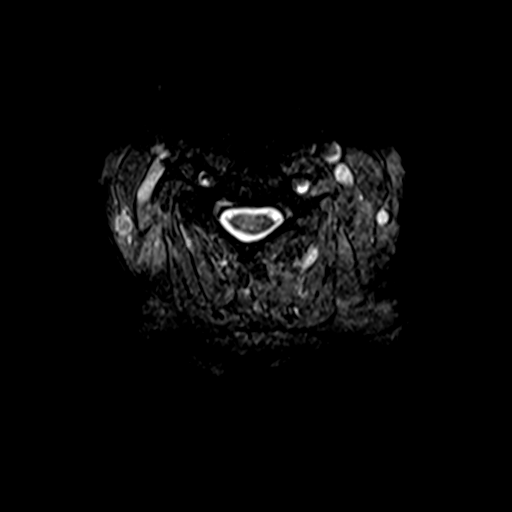
[im 20/29]
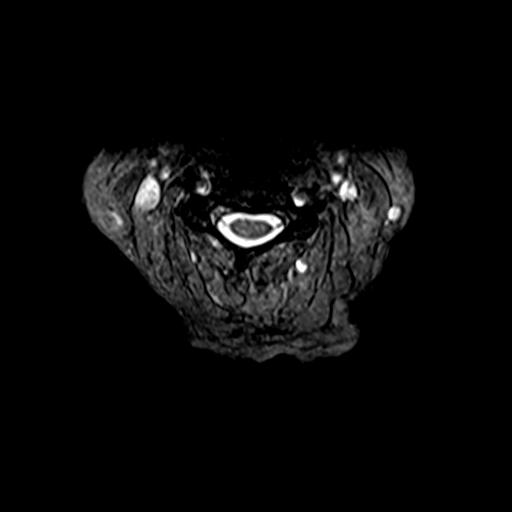
[im 24/29]
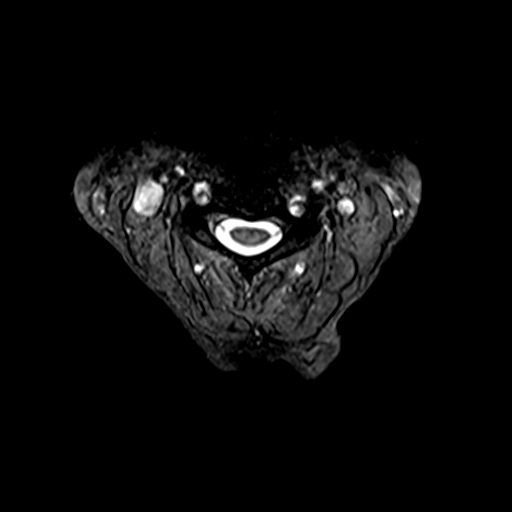
[im 29/29]
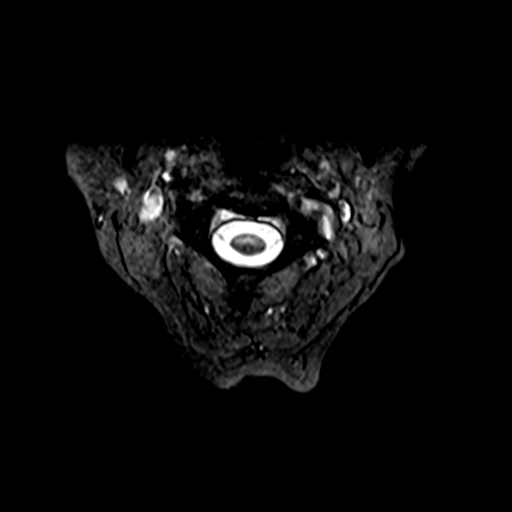

[39 of 48 positions shown; findings below may reference images not displayed]

FINDINGS: Alignment: Physiologic.

Vertebrae: No acute fracture, evidence of discitis, or bone lesion.

Cord: Normal signal and morphology.

Posterior Fossa, vertebral arteries, paraspinal tissues: Posterior
fossa demonstrates no focal abnormality. Vertebral artery flow voids
are maintained. Paraspinal soft tissues are unremarkable.

Disc levels:

Discs: Degenerative disease with disc height loss at C3-4, C4-5 and
C5-6.

C2-3: No significant disc bulge. No neural foraminal stenosis. No
central canal stenosis.

C3-4: Mild broad-based disc bulge. Moderate right and mild left
foraminal stenosis. No central canal stenosis.

C4-5: Mild broad-based disc bulge. Bilateral uncovertebral
degenerative changes. Mild left and moderate right foraminal
stenosis. No central canal stenosis.

C5-6: Broad-based disc osteophyte complex. Bilateral uncovertebral
degenerative changes. Severe right foraminal stenosis. No left
foraminal stenosis. Mild right facet arthropathy.

C6-7: No significant disc bulge. Mild right facet arthropathy.
Moderate right foraminal stenosis. Mild left foraminal stenosis. No
central canal stenosis.

C7-T1: No significant disc bulge. No neural foraminal stenosis. No
central canal stenosis.
IMPRESSION: 1.  No acute osseous injury of the cervical spine.
2. Cervical spine spondylosis as described above.

## 2022-02-02 ENCOUNTER — Ambulatory Visit (INDEPENDENT_AMBULATORY_CARE_PROVIDER_SITE_OTHER): Payer: Medicare Other

## 2022-02-02 ENCOUNTER — Ambulatory Visit (INDEPENDENT_AMBULATORY_CARE_PROVIDER_SITE_OTHER): Payer: Medicare Other | Admitting: Podiatry

## 2022-02-02 ENCOUNTER — Encounter: Payer: Self-pay | Admitting: Podiatry

## 2022-02-02 DIAGNOSIS — M7751 Other enthesopathy of right foot: Secondary | ICD-10-CM | POA: Diagnosis not present

## 2022-02-02 DIAGNOSIS — M778 Other enthesopathies, not elsewhere classified: Secondary | ICD-10-CM | POA: Diagnosis not present

## 2022-02-02 MED ORDER — TRIAMCINOLONE ACETONIDE 10 MG/ML IJ SUSP
10.0000 mg | Freq: Once | INTRAMUSCULAR | Status: AC
  Administered 2022-02-02: 10 mg

## 2022-02-04 NOTE — Progress Notes (Signed)
Subjective:   Patient ID: Kelly Keith, female   DOB: 75 y.o.   MRN: [id number]   HPI Patient presents with a lot of pain in the right ankle that is been present for several months.  Does not remember specific injury states that its very tender   ROS      Objective:  Physical Exam  Neurovascular status intact with exquisite discomfort several months duration right sinus tarsi with inflammation fluid buildup     Assessment:  Sinus tarsitis right with inflammation     Plan:  Sterile prep went ahead and injected the sinus tarsi 3 mg Kenalog 5 mg Xylocaine advised on anti-inflammatories physical therapy reappoint to recheck  X-rays do not indicate severe arthritis of the joint moderate flatfoot deformity noted

## 2022-03-07 ENCOUNTER — Other Ambulatory Visit: Payer: Self-pay | Admitting: Dermatology

## 2022-03-07 ENCOUNTER — Ambulatory Visit
Admission: RE | Admit: 2022-03-07 | Discharge: 2022-03-07 | Disposition: A | Discharge status: Home / Self Care | Payer: Medicare Other | Source: Ambulatory Visit | Attending: Dermatology | Admitting: Dermatology

## 2022-03-07 DIAGNOSIS — L603 Nail dystrophy: Secondary | ICD-10-CM

## 2022-03-14 ENCOUNTER — Ambulatory Visit
Admission: EM | Admit: 2022-03-14 | Discharge: 2022-03-14 | Disposition: A | Discharge status: Home / Self Care | Payer: Medicare Other | Attending: Emergency Medicine | Admitting: Emergency Medicine

## 2022-03-14 DIAGNOSIS — R350 Frequency of micturition: Secondary | ICD-10-CM | POA: Diagnosis present

## 2022-03-14 LAB — POCT URINALYSIS DIP (MANUAL ENTRY)
Bilirubin, UA: NEGATIVE
Glucose, UA: NEGATIVE mg/dL
Ketones, POC UA: NEGATIVE mg/dL
Nitrite, UA: NEGATIVE
Protein Ur, POC: NEGATIVE mg/dL
Spec Grav, UA: 1.015 (ref 1.010–1.025)
Urobilinogen, UA: 0.2 E.U./dL
pH, UA: 7 (ref 5.0–8.0)

## 2022-03-14 MED ORDER — CEPHALEXIN 500 MG PO CAPS: 500.0000 mg | ORAL_CAPSULE | Freq: Two times a day (BID) | ORAL | 0 refills | Status: AC

## 2022-03-14 NOTE — Discharge Instructions (Addendum)
Your urinalysis shows Kelly Keith blood cells to help to fight infection but it does not currently show bacteria therefore your urine has been sent to the lab to determine if and what bacteria is present, if any changes need to be made to your medications you will be notified via telephone  We will treat symptoms prophylactically today, Begin use of Keflex every morning and every evening for 5 days  You may continue use of over-the-counter Azo to help minimize your symptoms until antibiotic removes bacteria, this medication will turn your urine orange  Increase your fluid intake through use of water  As always practice good hygiene, wiping front to back and avoidance of scented vaginal products to prevent further irritation  If symptoms continue to persist after use of medication or recur please follow-up with urgent care or your primary doctor as needed

## 2022-03-14 NOTE — ED Provider Notes (Signed)
[name]    CSN: [id number] Arrival date & time: 03/14/22  1705      History   Chief Complaint Chief Complaint  Patient presents with   Dysuria    HPI Kelly Keith is a 76 y.o. female.   Patient presents with urinary frequency, lower abdominal pressure and dysuria beginning today.  Endorses that 5 days ago she had similar symptoms that resolved after use of Azo and Ursodiol.  Has not attempted treatment further.  Denies flank pain, fever, chills, hematuria, new rash or lesions, vaginal discharge, vaginal odor.  Endorses that she has had some vaginal itching but is currently taking fluconazole for a fungal infection of the finger, last dose this morning.  Past Medical History:  Diagnosis Date   Arthritis    Cancer ([place]) 2016   left lower eye lid   GERD (gastroesophageal reflux disease)    occas   Motion sickness    cars   Osteoporosis    Shoulder pain, right    Vertigo    in past    Patient Active Problem List   Diagnosis Date Noted   Dyspnea on exertion 03/07/2017   Chest pain 03/07/2017   Rotator cuff tendinitis, right 05/20/2015   Spondylosis of cervical region without myelopathy or radiculopathy 05/20/2015   Rotator cuff impingement syndrome of right shoulder 02/05/2014    Past Surgical History:  Procedure Laterality Date   ABDOMINAL HYSTERECTOMY     AMPUTATION TOE Right 1980's   right 2nd toe due to infection   BROW LIFT Bilateral 08/30/2015   Procedure: BLEPHAROPLASTY;  Surgeon: Karle Starch, MD;  Location: [facility];  Service: Ophthalmology;  Laterality: Bilateral;   ECTROPION REPAIR Right 08/30/2015   Procedure: REPAIR OF ECTROPION;  Surgeon: Karle Starch, MD;  Location: [facility];  Service: Ophthalmology;  Laterality: Right;   EYE SURGERY     FOOT SURGERY     PTOSIS REPAIR Bilateral 08/30/2015   Procedure: PTOSIS REPAIR;  Surgeon: Karle Starch, MD;  Location: [place];  Service: Ophthalmology;  Laterality:  Bilateral;  eyelids   TONSILLECTOMY AND ADENOIDECTOMY      OB History     Gravida  1   Para  1   Term      Preterm      AB      Living  1      SAB      IAB      Ectopic      Multiple      Live Births               Home Medications    Prior to Admission medications   Medication Sig Start Date End Date Taking? Authorizing Provider  cephALEXin (KEFLEX) 500 MG capsule Take 1 capsule (500 mg total) by mouth 2 (two) times daily for 5 days. 03/14/22 03/19/22 Yes Riniyah Speich R, NP  clobetasol cream (TEMOVATE) 8.14 % Apply 1 application topically 2 (two) times daily.    [provider]  Coenzyme Q10 (COQ10 PO) Take by mouth daily.    [provider]  ezetimibe (ZETIA) 10 MG tablet Take 1 tablet by mouth daily. 05/01/21   [provider]  fexofenadine (ALLEGRA) 180 MG tablet Take by mouth daily.     [provider]  GARLIC PO Take by mouth daily.    [provider]  Ginger, Zingiber officinalis, (GINGER PO) Take by mouth daily.    [provider]  ketoconazole (NIZORAL) 2 % cream Apply 1 application topically daily as needed.  06/26/16   [provider]  Lutein 6 MG CAPS Take by mouth.    [provider]  Milk Thistle Extract 87.5 MG CAPS Take by mouth.    [provider]  Multiple Vitamin (MULTIVITAMIN) tablet Take 1 tablet by mouth daily.    [provider]  Nutritional Supplements (CARDIO COMPLETE PO) Take by mouth daily.    [provider]  OVER THE COUNTER MEDICATION daily. Joint advantage    [provider]  OVER THE COUNTER MEDICATION 2 (two) times daily. True osteo (bone health)    [provider]  Papaya CHEW Chew by mouth 2 (two) times daily.    [provider]  Probiotic Product (PROBIOTIC DAILY PO) Take by mouth daily.    [provider]  Turmeric POWD Take by mouth daily.     [provider]    Family  History Family History  Problem Relation Age of Onset   Heart disease Mother    Arthritis Mother    Anxiety disorder Mother    Osteoporosis Mother    Lung cancer Father    Congestive Heart Failure Father    High blood pressure Father     Social History Social History   Tobacco Use   Smoking status: Never   Smokeless tobacco: Never  Vaping Use   Vaping Use: Never used  Substance Use Topics   Alcohol use: No    Alcohol/week: 0.0 standard drinks of alcohol   Drug use: Never     Allergies   Nsaids, Rosuvastatin calcium, and Septra [sulfamethoxazole-trimethoprim]   Review of Systems Review of Systems  Constitutional: Negative.   Respiratory: Negative.    Cardiovascular: Negative.   Genitourinary:  Positive for dysuria, frequency and pelvic pain. Negative for decreased urine volume, difficulty urinating, dyspareunia, enuresis, flank pain, genital sores, hematuria, menstrual problem, urgency, vaginal bleeding, vaginal discharge and vaginal pain.  Skin: Negative.      Physical Exam Triage Vital Signs ED Triage Vitals  Enc Vitals Group     BP 03/14/22 1735 (!) 147/69     Pulse Rate 03/14/22 1735 78     Resp 03/14/22 1735 18     Temp 03/14/22 1735 97.7 F (36.5 C)     Temp src --      SpO2 --      Weight --      Height --      Head Circumference --      Peak Flow --      Pain Score 03/14/22 1733 0     Pain Loc --      Pain Edu? --      Excl. in [place]? --    No data found.  Updated Vital Signs BP (!) 147/69   Pulse 78   Temp 97.7 F (36.5 C)   Resp 18   LMP  (LMP Unknown)   Visual Acuity Right Eye Distance:   Left Eye Distance:   Bilateral Distance:    Right Eye Near:   Left Eye Near:    Bilateral Near:     Physical Exam Constitutional:      Appearance: Normal appearance.  HENT:     Head: Normocephalic.  Eyes:     Extraocular Movements: Extraocular movements intact.  Pulmonary:     Effort: Pulmonary effort is normal.  Abdominal:     General:  Abdomen is flat. Bowel sounds are  normal.     Palpations: Abdomen is soft.     Tenderness: There is abdominal tenderness in the suprapubic area. There is no right CVA tenderness or left CVA tenderness.  Neurological:     Mental Status: She is alert.  Psychiatric:        Mood and Affect: Mood normal.        Behavior: Behavior normal.      UC Treatments / Results  Labs (all labs ordered are listed, but only abnormal results are displayed) Labs Reviewed  POCT URINALYSIS DIP (MANUAL ENTRY) - Abnormal; Notable for the following components:      Result Value   Clarity, UA cloudy (*)    Blood, UA small (*)    Leukocytes, UA Large (3+) (*)    All other components within normal limits  URINE CULTURE    EKG   Radiology No results found.  Procedures Procedures (including critical care time)  Medications Ordered in UC Medications - No data to display  Initial Impression / Assessment and Plan / UC Course  I have reviewed the triage vital signs and the nursing notes.  Pertinent labs & imaging results that were available during my care of the patient were reviewed by me and considered in my medical decision making (see chart for details).  Urinary frequency  Urinalysis showing leukocytes but negative for nitrates, sent for culture, discussed findings with patient, based on symptoms and recurrence we will prophylactically provide coverage, Keflex prescribed and recommended continued use of Azo as well as increase fluid intake and good hygiene for additional supportive measures, given strict precautions that if symptoms persist, recur or worsen to follow-up with urgent care or PCP for further evaluation and treatment Final Clinical Impressions(s) / UC Diagnoses   Final diagnoses:  Urinary frequency     Discharge Instructions      Your urinalysis shows Chiyo Fay blood cells to help to fight infection but it does not currently show bacteria therefore your urine has been sent to the  lab to determine if and what bacteria is present, if any changes need to be made to your medications you will be notified via telephone  We will treat symptoms prophylactically today, Begin use of Keflex every morning and every evening for 5 days  You may continue use of over-the-counter Azo to help minimize your symptoms until antibiotic removes bacteria, this medication will turn your urine orange  Increase your fluid intake through use of water  As always practice good hygiene, wiping front to back and avoidance of scented vaginal products to prevent further irritation  If symptoms continue to persist after use of medication or recur please follow-up with urgent care or your primary doctor as needed    ED Prescriptions     Medication Sig Dispense Auth. Provider   cephALEXin (KEFLEX) 500 MG capsule Take 1 capsule (500 mg total) by mouth 2 (two) times daily for 5 days. 10 capsule Hans Eden, NP      PDMP not reviewed this encounter.   Hans Eden, [place] 03/14/22 1801

## 2022-03-14 NOTE — ED Triage Notes (Signed)
Pt presents with c/o urinary frequency and pressure that first began on Friday. Took AZO at [facility]

## 2022-03-16 LAB — URINE CULTURE: Culture: >=100000 — AB

## 2022-03-21 ENCOUNTER — Ambulatory Visit
Admission: EM | Admit: 2022-03-21 | Discharge: 2022-03-21 | Disposition: A | Discharge status: Home / Self Care | Payer: Medicare Other | Attending: Emergency Medicine | Admitting: Emergency Medicine

## 2022-03-21 DIAGNOSIS — N390 Urinary tract infection, site not specified: Secondary | ICD-10-CM | POA: Insufficient documentation

## 2022-03-21 LAB — POCT URINALYSIS DIP (MANUAL ENTRY)
Bilirubin, UA: NEGATIVE
Glucose, UA: 100 mg/dL — AB
Ketones, POC UA: NEGATIVE mg/dL
Nitrite, UA: POSITIVE — AB
Spec Grav, UA: 1.015 (ref 1.010–1.025)
Urobilinogen, UA: 0.2 E.U./dL
pH, UA: 7 (ref 5.0–8.0)

## 2022-03-21 MED ORDER — CEFDINIR 300 MG PO CAPS: 300.0000 mg | ORAL_CAPSULE | Freq: Two times a day (BID) | ORAL | 0 refills | Status: AC

## 2022-03-21 NOTE — ED Provider Notes (Signed)
[facility]    CSN: [id number] Arrival date & time: 03/21/22  1430      History   Chief Complaint Chief Complaint  Patient presents with   Dysuria    HPI Kelly Keith is a 76 y.o. female.  Patient presents with dysuria and urinary frequency today.  She recently completed a 5-day course of cephalexin for a UTI.  She states her symptoms resolved after treatment but then returned today.  No fever, chills, abdominal pain, flank pain, or other symptoms.  She took 1 dose of Azo today.  Patient was seen at this urgent care on 03/14/2022; diagnosed with urinary frequency; treated with cephalexin; urine culture positive for E. coli which was sensitive to treatment.  The history is provided by the patient and medical records.    Past Medical History:  Diagnosis Date   Arthritis    Cancer ([place]) 2016   left lower eye lid   GERD (gastroesophageal reflux disease)    occas   Motion sickness    cars   Osteoporosis    Shoulder pain, right    Vertigo    in past    Patient Active Problem List   Diagnosis Date Noted   Dyspnea on exertion 03/07/2017   Chest pain 03/07/2017   Rotator cuff tendinitis, right 05/20/2015   Spondylosis of cervical region without myelopathy or radiculopathy 05/20/2015   Rotator cuff impingement syndrome of right shoulder 02/05/2014    Past Surgical History:  Procedure Laterality Date   ABDOMINAL HYSTERECTOMY     AMPUTATION TOE Right 1980's   right 2nd toe due to infection   BROW LIFT Bilateral 08/30/2015   Procedure: BLEPHAROPLASTY;  Surgeon: Karle Starch, MD;  Location: [facility];  Service: Ophthalmology;  Laterality: Bilateral;   ECTROPION REPAIR Right 08/30/2015   Procedure: REPAIR OF ECTROPION;  Surgeon: Karle Starch, MD;  Location: [place];  Service: Ophthalmology;  Laterality: Right;   EYE SURGERY     FOOT SURGERY     PTOSIS REPAIR Bilateral 08/30/2015   Procedure: PTOSIS REPAIR;  Surgeon: Karle Starch, MD;  Location:  [facility];  Service: Ophthalmology;  Laterality: Bilateral;  eyelids   TONSILLECTOMY AND ADENOIDECTOMY      OB History     Gravida  1   Para  1   Term      Preterm      AB      Living  1      SAB      IAB      Ectopic      Multiple      Live Births               Home Medications    Prior to Admission medications   Medication Sig Start Date End Date Taking? Authorizing Provider  cefdinir (OMNICEF) 300 MG capsule Take 1 capsule (300 mg total) by mouth 2 (two) times daily for 7 days. 03/21/22 03/28/22 Yes Sharion Balloon, NP  clobetasol cream (TEMOVATE) 3.14 % Apply 1 application topically 2 (two) times daily.    [provider]  Coenzyme Q10 (COQ10 PO) Take by mouth daily.    [provider]  ezetimibe (ZETIA) 10 MG tablet Take 1 tablet by mouth daily. 05/01/21   [provider]  fexofenadine (ALLEGRA) 180 MG tablet Take by mouth daily.     [provider]  GARLIC PO Take by mouth daily.    [provider]  Ginger, Zingiber officinalis, (GINGER PO) Take by mouth daily.    [provider]  ketoconazole (NIZORAL) 2 % cream Apply 1 application topically daily as needed.  06/26/16   [provider]  Lutein 6 MG CAPS Take by mouth.    [provider]  Milk Thistle Extract 87.5 MG CAPS Take by mouth.    [provider]  Multiple Vitamin (MULTIVITAMIN) tablet Take 1 tablet by mouth daily.    [provider]  Nutritional Supplements (CARDIO COMPLETE PO) Take by mouth daily.    [provider]  OVER THE COUNTER MEDICATION daily. Joint advantage    [provider]  OVER THE COUNTER MEDICATION 2 (two) times daily. True osteo (bone health)    [provider]  Papaya CHEW Chew by mouth 2 (two) times daily.    [provider]  Probiotic Product (PROBIOTIC DAILY PO) Take by mouth daily.    [provider]  Turmeric POWD Take by mouth daily.      [provider]    Family History Family History  Problem Relation Age of Onset   Heart disease Mother    Arthritis Mother    Anxiety disorder Mother    Osteoporosis Mother    Lung cancer Father    Congestive Heart Failure Father    High blood pressure Father     Social History Social History   Tobacco Use   Smoking status: Never   Smokeless tobacco: Never  Vaping Use   Vaping Use: Never used  Substance Use Topics   Alcohol use: No    Alcohol/week: 0.0 standard drinks of alcohol   Drug use: Never     Allergies   Nsaids, Rosuvastatin calcium, and Septra [sulfamethoxazole-trimethoprim]   Review of Systems Review of Systems  Constitutional:  Negative for chills and fever.  Gastrointestinal:  Negative for abdominal pain.  Genitourinary:  Positive for dysuria and frequency. Negative for hematuria, pelvic pain and vaginal discharge.  All other systems reviewed and are negative.    Physical Exam Triage Vital Signs ED Triage Vitals  Enc Vitals Group     BP      Pulse      Resp      Temp      Temp src      SpO2      Weight      Height      Head Circumference      Peak Flow      Pain Score      Pain Loc      Pain Edu?      Excl. in [place]?    No data found.  Updated Vital Signs BP (!) 143/84   Pulse 79   Temp 98.7 F (37.1 C)   Resp 18   Ht '5\' 8"'$  (1.727 m)   Wt 162 lb (73.5 kg)   LMP  (LMP Unknown)   SpO2 98%   BMI 24.63 kg/m   Visual Acuity Right Eye Distance:   Left Eye Distance:   Bilateral Distance:    Right Eye Near:   Left Eye Near:    Bilateral Near:     Physical Exam Vitals and nursing note reviewed.  Constitutional:      General: She is not in acute distress.    Appearance: Normal appearance. She is well-developed. She is not ill-appearing.  HENT:     Mouth/Throat:     Mouth: Mucous membranes are moist.  Cardiovascular:  Rate and Rhythm: Normal rate and regular rhythm.     Heart sounds: Normal heart sounds.   Pulmonary:     Effort: Pulmonary effort is normal. No respiratory distress.     Breath sounds: Normal breath sounds.  Abdominal:     General: Bowel sounds are normal.     Palpations: Abdomen is soft.     Tenderness: There is no abdominal tenderness. There is no right CVA tenderness, left CVA tenderness, guarding or rebound.  Musculoskeletal:     Cervical back: Neck supple.  Skin:    General: Skin is warm and dry.  Neurological:     Mental Status: She is alert.  Psychiatric:        Mood and Affect: Mood normal.        Behavior: Behavior normal.      UC Treatments / Results  Labs (all labs ordered are listed, but only abnormal results are displayed) Labs Reviewed  POCT URINALYSIS DIP (MANUAL ENTRY) - Abnormal; Notable for the following components:      Result Value   Color, UA orange (*)    Clarity, UA cloudy (*)    Glucose, UA =100 (*)    Blood, UA moderate (*)    Protein Ur, POC trace (*)    Nitrite, UA Positive (*)    Leukocytes, UA Large (3+) (*)    All other components within normal limits  URINE CULTURE    EKG   Radiology No results found.  Procedures Procedures (including critical care time)  Medications Ordered in UC Medications - No data to display  Initial Impression / Assessment and Plan / UC Course  I have reviewed the triage vital signs and the nursing notes.  Pertinent labs & imaging results that were available during my care of the patient were reviewed by me and considered in my medical decision making (see chart for details).   UTI.  Based on previous urine culture, allergies, renal function, and age, treating with cefdinir. Today's urine culture pending. Discussed with patient that we will call her if the urine culture shows the need to change or discontinue the antibiotic. Instructed her to follow-up with her PCP for a recheck due to recurrence of symptoms. Patient agrees to plan of care.      Final Clinical Impressions(s) / UC Diagnoses    Final diagnoses:  Urinary tract infection without hematuria, site unspecified     Discharge Instructions      Take the antibiotic as directed.  The urine culture is pending.  We will call you if it shows the need to change or discontinue your antibiotic.    Follow up with your primary care provider for your recurrent urinary symptoms.        ED Prescriptions     Medication Sig Dispense Auth. Provider   cefdinir (OMNICEF) 300 MG capsule Take 1 capsule (300 mg total) by mouth 2 (two) times daily for 7 days. 14 capsule Sharion Balloon, NP      PDMP not reviewed this encounter.   Sharion Balloon, NP 03/21/22 [phone]

## 2022-03-21 NOTE — Discharge Instructions (Addendum)
Take the antibiotic as directed.  The urine culture is pending.  We will call you if it shows the need to change or discontinue your antibiotic.    Follow up with your primary care provider for your recurrent urinary symptoms.

## 2022-03-21 NOTE — ED Triage Notes (Signed)
Patient to Urgent Care with complaints of dysuria and urinary frequency that started today. Reports bladder pressure.  Recently seen for the same, has completed course of keflex. Has also continued to take Azo. States that after completing the antibiotics the symptoms resolved but have now returned.

## 2022-03-23 LAB — URINE CULTURE: Culture: >=100000 — AB

## 2022-05-21 ENCOUNTER — Encounter (INDEPENDENT_AMBULATORY_CARE_PROVIDER_SITE_OTHER): Payer: Self-pay

## 2022-07-14 ENCOUNTER — Ambulatory Visit
Admission: EM | Admit: 2022-07-14 | Discharge: 2022-07-14 | Disposition: A | Discharge status: Home / Self Care | Payer: Medicare Other | Attending: Urgent Care | Admitting: Urgent Care

## 2022-07-14 DIAGNOSIS — N3001 Acute cystitis with hematuria: Secondary | ICD-10-CM | POA: Insufficient documentation

## 2022-07-14 LAB — POCT URINALYSIS DIP (MANUAL ENTRY)
Bilirubin, UA: NEGATIVE
Glucose, UA: 100 mg/dL — AB
Ketones, POC UA: NEGATIVE mg/dL
Nitrite, UA: POSITIVE — AB
Protein Ur, POC: 30 mg/dL — AB
Spec Grav, UA: 1.015 (ref 1.010–1.025)
Urobilinogen, UA: 1 E.U./dL
pH, UA: 7 (ref 5.0–8.0)

## 2022-07-14 MED ORDER — CEPHALEXIN 500 MG PO CAPS: 500.0000 mg | ORAL_CAPSULE | Freq: Four times a day (QID) | ORAL | 0 refills | Status: AC

## 2022-07-14 NOTE — ED Triage Notes (Signed)
Pt. Presents to UC w/ c/o dysuria and urinary frequency that started last night.

## 2022-07-14 NOTE — ED Provider Notes (Signed)
Kelly Keith    CSN: [id number] Arrival date & time: 07/14/22  8841      History   Chief Complaint Chief Complaint  Patient presents with   Urinary Frequency   Dysuria    HPI Kelly Keith is a 76 y.o. female.    Urinary Frequency  Dysuria   Presents To urgent care with complaint of dysuria and frequency starting last night.  Denies back pain, abdominal pain, fever.  Past Medical History:  Diagnosis Date   Arthritis    Cancer ([place]) 2016   left lower eye lid   GERD (gastroesophageal reflux disease)    occas   Motion sickness    cars   Osteoporosis    Shoulder pain, right    Vertigo    in past    Patient Active Problem List   Diagnosis Date Noted   Dyspnea on exertion 03/07/2017   Chest pain 03/07/2017   Rotator cuff tendinitis, right 05/20/2015   Spondylosis of cervical region without myelopathy or radiculopathy 05/20/2015   Rotator cuff impingement syndrome of right shoulder 02/05/2014    Past Surgical History:  Procedure Laterality Date   ABDOMINAL HYSTERECTOMY     AMPUTATION TOE Right 1980's   right 2nd toe due to infection   BROW LIFT Bilateral 08/30/2015   Procedure: BLEPHAROPLASTY;  Surgeon: Karle Starch, MD;  Location: [facility];  Service: Ophthalmology;  Laterality: Bilateral;   ECTROPION REPAIR Right 08/30/2015   Procedure: REPAIR OF ECTROPION;  Surgeon: Karle Starch, MD;  Location: [facility];  Service: Ophthalmology;  Laterality: Right;   EYE SURGERY     FOOT SURGERY     PTOSIS REPAIR Bilateral 08/30/2015   Procedure: PTOSIS REPAIR;  Surgeon: Karle Starch, MD;  Location: [facility];  Service: Ophthalmology;  Laterality: Bilateral;  eyelids   TONSILLECTOMY AND ADENOIDECTOMY      OB History     Gravida  1   Para  1   Term      Preterm      AB      Living  1      SAB      IAB      Ectopic      Multiple      Live Births               Home Medications    Prior to Admission  medications   Medication Sig Start Date End Date Taking? Authorizing Provider  clobetasol cream (TEMOVATE) 6.60 % Apply 1 application topically 2 (two) times daily.    [provider]  Coenzyme Q10 (COQ10 PO) Take by mouth daily.    [provider]  ezetimibe (ZETIA) 10 MG tablet Take 1 tablet by mouth daily. 05/01/21   [provider]  fexofenadine (ALLEGRA) 180 MG tablet Take by mouth daily.     [provider]  GARLIC PO Take by mouth daily.    [provider]  Ginger, Zingiber officinalis, (GINGER PO) Take by mouth daily.    [provider]  ketoconazole (NIZORAL) 2 % cream Apply 1 application topically daily as needed.  06/26/16   [provider]  Lutein 6 MG CAPS Take by mouth.    [provider]  Milk Thistle Extract 87.5 MG CAPS Take by mouth.    [provider]  Multiple Vitamin (MULTIVITAMIN) tablet Take 1 tablet by mouth daily.    [provider]  Nutritional Supplements (CARDIO COMPLETE  PO) Take by mouth daily.    [provider]  OVER THE COUNTER MEDICATION daily. Joint advantage    [provider]  OVER THE COUNTER MEDICATION 2 (two) times daily. True osteo (bone health)    [provider]  Papaya CHEW Chew by mouth 2 (two) times daily.    [provider]  Probiotic Product (PROBIOTIC DAILY PO) Take by mouth daily.    [provider]  Turmeric POWD Take by mouth daily.     [provider]    Family History Family History  Problem Relation Age of Onset   Heart disease Mother    Arthritis Mother    Anxiety disorder Mother    Osteoporosis Mother    Lung cancer Father    Congestive Heart Failure Father    High blood pressure Father     Social History Social History   Tobacco Use   Smoking status: Never   Smokeless tobacco: Never  Vaping Use   Vaping Use: Never used  Substance Use Topics   Alcohol use: No    Alcohol/week: 0.0  standard drinks of alcohol   Drug use: Never     Allergies   Nsaids, Rosuvastatin calcium, and Septra [sulfamethoxazole-trimethoprim]   Review of Systems Review of Systems  Genitourinary:  Positive for dysuria and frequency.     Physical Exam Triage Vital Signs ED Triage Vitals [07/14/22 0937]  Enc Vitals Group     BP (!) 143/80     Pulse Rate 81     Resp 17     Temp 97.8 F (36.6 C)     Temp src      SpO2 98 %     Weight      Height      Head Circumference      Peak Flow      Pain Score      Pain Loc      Pain Edu?      Excl. in [place]?    No data found.  Updated Vital Signs BP (!) 143/80   Pulse 81   Temp 97.8 F (36.6 C)   Resp 17   LMP  (LMP Unknown)   SpO2 98%   Visual Acuity Right Eye Distance:   Left Eye Distance:   Bilateral Distance:    Right Eye Near:   Left Eye Near:    Bilateral Near:     Physical Exam Vitals reviewed.  Constitutional:      Appearance: Normal appearance.  Skin:    General: Skin is warm and dry.  Neurological:     General: No focal deficit present.     Mental Status: She is alert and oriented to person, place, and time.  Psychiatric:        Mood and Affect: Mood normal.        Behavior: Behavior normal.      UC Treatments / Results  Labs (all labs ordered are listed, but only abnormal results are displayed) Labs Reviewed  POCT URINALYSIS DIP (MANUAL ENTRY) - Abnormal; Notable for the following components:      Result Value   Clarity, UA cloudy (*)    Glucose, UA =100 (*)    Blood, UA moderate (*)    Protein Ur, POC =30 (*)    Nitrite, UA Positive (*)    Leukocytes, UA Large (3+) (*)    All other components within normal limits    EKG   Radiology No results found.  Procedures Procedures (including critical care time)  Medications Ordered in UC Medications - No data to display  Initial Impression / Assessment and Plan / UC Course  I have reviewed the triage vital signs and the nursing  notes.  Pertinent labs & imaging results that were available during my care of the patient were reviewed by me and considered in my medical decision making (see chart for details).   UA is positive for large leukocytes, nitrites, moderate blood.  Urine is cloudy.  She had UTI on 03/30/2022 recurrent from 03/21/2022 and 03/14/2022.  Most recent 2 cultures with E. coli and resistant to ampicillin, sensitive to Cipro and Bactrim.  This is possibly chronic UTI.  Will treat for acute cystitis with hematuria using Bactrim. Most recent GFR = 54.   Final Clinical Impressions(s) / UC Diagnoses   Final diagnoses:  None   Discharge Instructions   None    ED Prescriptions   None    PDMP not reviewed this encounter.   Rose Phi, [place] 07/14/22 [phone]

## 2022-07-14 NOTE — Discharge Instructions (Signed)
Follow up here or with your primary care provider if your symptoms are worsening or not improving with treatment.     

## 2022-07-17 ENCOUNTER — Telehealth (HOSPITAL_COMMUNITY): Payer: Self-pay | Admitting: Emergency Medicine

## 2022-07-17 LAB — URINE CULTURE: Culture: >=100000 — AB

## 2022-07-17 MED ORDER — NITROFURANTOIN MONOHYD MACRO 100 MG PO CAPS: 100.0000 mg | ORAL_CAPSULE | Freq: Two times a day (BID) | ORAL | 0 refills | Status: AC

## 2022-09-03 ENCOUNTER — Encounter: Payer: Self-pay | Admitting: Obstetrics & Gynecology

## 2022-09-13 ENCOUNTER — Ambulatory Visit: Payer: Medicare Other | Admitting: Obstetrics & Gynecology

## 2022-09-17 ENCOUNTER — Ambulatory Visit
Admission: RE | Admit: 2022-09-17 | Discharge: 2022-09-17 | Disposition: A | Discharge status: Home / Self Care | Payer: Medicare Other | Source: Ambulatory Visit | Attending: Physical Medicine and Rehabilitation | Admitting: Physical Medicine and Rehabilitation

## 2022-09-17 ENCOUNTER — Other Ambulatory Visit: Payer: Self-pay | Admitting: Physical Medicine and Rehabilitation

## 2022-09-17 DIAGNOSIS — M5416 Radiculopathy, lumbar region: Secondary | ICD-10-CM | POA: Diagnosis present

## 2022-09-20 ENCOUNTER — Ambulatory Visit: Payer: Medicare Other | Admitting: Obstetrics & Gynecology

## 2022-10-01 ENCOUNTER — Other Ambulatory Visit: Payer: Self-pay | Admitting: Physical Medicine and Rehabilitation

## 2022-10-01 DIAGNOSIS — M8000XA Age-related osteoporosis with current pathological fracture, unspecified site, initial encounter for fracture: Secondary | ICD-10-CM

## 2022-10-02 ENCOUNTER — Ambulatory Visit
Admission: RE | Admit: 2022-10-02 | Discharge: 2022-10-02 | Disposition: A | Discharge status: Home / Self Care | Payer: Medicare Other | Source: Ambulatory Visit | Attending: Physical Medicine and Rehabilitation | Admitting: Physical Medicine and Rehabilitation

## 2022-10-02 DIAGNOSIS — M8000XA Age-related osteoporosis with current pathological fracture, unspecified site, initial encounter for fracture: Secondary | ICD-10-CM

## 2022-10-02 HISTORY — PX: IR RADIOLOGIST EVAL & MGMT: IMG5224

## 2022-10-02 NOTE — Consult Note (Addendum)
Chief Complaint: Low Back Pain  Referring Physician(s): Chasnis,Benjamin  Patient Status: [facility] - Out-pt  History of Present Illness: Kelly Keith is a 77 y.o. female with history of Osteoporosis and chronic low back pain, felt worsening acute back pain while helping her husband move a TV in mid February.  Although her pain wasn't severe at first, it progressively worsened. MRI of the lumbar spine was performed on 09/17/2022 and demonstrated acute L1 fracture with 30% vertebral body height loss.   Her pain was mildly improved with Tramadol but she has not been able to take much of it due to side effects.  She is currently using Tylenol, muscle relaxant, and lidocaine patch but her pain remains at 8/10.   Past Medical History:  Diagnosis Date   Arthritis    Cancer ([place]) 2016   left lower eye lid   GERD (gastroesophageal reflux disease)    occas   Motion sickness    cars   Osteoporosis    Shoulder pain, right    Vertigo    in past    Past Surgical History:  Procedure Laterality Date   ABDOMINAL HYSTERECTOMY     AMPUTATION TOE Right 1980's   right 2nd toe due to infection   BROW LIFT Bilateral 08/30/2015   Procedure: BLEPHAROPLASTY;  Surgeon: Karle Starch, MD;  Location: [facility];  Service: Ophthalmology;  Laterality: Bilateral;   ECTROPION REPAIR Right 08/30/2015   Procedure: REPAIR OF ECTROPION;  Surgeon: Karle Starch, MD;  Location: [facility];  Service: Ophthalmology;  Laterality: Right;   EYE SURGERY     FOOT SURGERY     PTOSIS REPAIR Bilateral 08/30/2015   Procedure: PTOSIS REPAIR;  Surgeon: Karle Starch, MD;  Location: [facility];  Service: Ophthalmology;  Laterality: Bilateral;  eyelids   TONSILLECTOMY AND ADENOIDECTOMY      Allergies: Nsaids, Rosuvastatin calcium, and Septra [sulfamethoxazole-trimethoprim]  Medications: Prior to Admission medications   Medication Sig Start Date End Date Taking? Authorizing Provider  acetaminophen  (TYLENOL) 500 MG tablet Take 500 mg by mouth every 6 (six) hours as needed.   Yes [provider]  clobetasol cream (TEMOVATE) [id number] % Apply 1 application topically 2 (two) times daily.   Yes [provider]  Coenzyme Q10 (COQ10 PO) Take by mouth daily.   Yes [provider]  ezetimibe (ZETIA) 10 MG tablet Take 1 tablet by mouth daily. 05/01/21  Yes [provider]  fexofenadine (ALLEGRA) 180 MG tablet Take by mouth daily.    Yes [provider]  GARLIC PO Take by mouth daily.   Yes [provider]  Ginger, Zingiber officinalis, (GINGER PO) Take by mouth daily.   Yes [provider]  ketoconazole (NIZORAL) 2 % cream Apply 1 application topically daily as needed.  06/26/16  Yes [provider]  lidocaine (HM LIDOCAINE PATCH) 4 % Place 1 patch onto the skin daily.   Yes [provider]  Lutein 6 MG CAPS Take by mouth.   Yes [provider]  methocarbamol (ROBAXIN) 500 MG tablet Take 250 mg by mouth 4 (four) times daily.   Yes [provider]  Milk Thistle Extract 87.5 MG CAPS Take by mouth.   Yes [provider]  Multiple Vitamin (MULTIVITAMIN) tablet Take 1 tablet by mouth daily.   Yes [provider]  Nutritional Supplements (CARDIO COMPLETE PO) Take by mouth daily.   Yes [provider]  OVER THE COUNTER MEDICATION daily. Joint  advantage   Yes [provider]  OVER THE COUNTER MEDICATION 2 (two) times daily. True osteo (bone health)   Yes [provider]  Papaya CHEW Chew by mouth 2 (two) times daily.   Yes [provider]  Probiotic Product (PROBIOTIC DAILY PO) Take by mouth daily.   Yes [provider]  Turmeric POWD Take by mouth daily.    Yes [provider]  nitrofurantoin, macrocrystal-monohydrate, (MACROBID) 100 MG capsule Take 1 capsule (100 mg total) by mouth 2 (two) times daily. Patient not taking: Reported on 10/02/2022  07/17/22   Lamptey, Myrene Galas, MD     Family History  Problem Relation Age of Onset   Heart disease Mother    Arthritis Mother    Anxiety disorder Mother    Osteoporosis Mother    Lung cancer Father    Congestive Heart Failure Father    High blood pressure Father     Social History   Socioeconomic History   Marital status: Married    Spouse name: Not on file   Number of children: Not on file   Years of education: Not on file   Highest education level: Not on file  Occupational History   Not on file  Tobacco Use   Smoking status: Never   Smokeless tobacco: Never  Vaping Use   Vaping Use: Never used  Substance and Sexual Activity   Alcohol use: No    Alcohol/week: 0.0 standard drinks of alcohol   Drug use: Never   Sexual activity: Not Currently    Comment: 1st intercourse- 76, married- 49 yrs, Hx of osteoporosis  Other Topics Concern   Not on file  Social History Narrative   Married, has one daughter. 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  All other systems are negative.  Review of Systems  Vital Signs: BP (!) 155/71 (BP Location: Left Arm, Patient Position: Sitting, Cuff Size: Normal)   Pulse 80   Temp 98.1 F (36.7 C) (Oral)   Resp 16   Wt 73.9 kg   LMP  (LMP Unknown)   SpO2 99%   BMI 24.78 kg/m   Advance Care Plan: The advanced care plan/surrogate decision maker was discussed at the time of visit and the patient did not wish to discuss or was not able to name a surrogate decision maker or provide an advance care plan.    Physical Exam Constitutional:      Appearance: Normal appearance.  Cardiovascular:     Rate and Rhythm:  Normal rate and regular rhythm.     Heart sounds: Normal heart sounds.  Pulmonary:     Effort: Pulmonary effort is normal.     Breath sounds: Normal breath sounds.  Abdominal:     General: Abdomen is flat.     Palpations: Abdomen is soft.  Skin:    General: Skin is warm and dry.  Neurological:     Mental Status: She is alert and oriented to person, place, and time.  Psychiatric:        Mood and Affect: Mood normal.        Behavior: Behavior normal.  Imaging: MR LUMBAR SPINE WO CONTRAST  Result Date: 09/17/2022 CLINICAL DATA:  Low back pain EXAM: MRI LUMBAR SPINE WITHOUT CONTRAST TECHNIQUE: Multiplanar, multisequence MR imaging of the lumbar spine was performed. No intravenous contrast was administered. COMPARISON:  Lumbar spine radiographs 05/05/2021 FINDINGS: Segmentation: Standard; the lowest formed disc space is designated L5-S1. Alignment: There is mild grade 1 retrolisthesis of L1 on L2, unchanged. Vertebrae: There is an acute to subacute compression fracture of the superior L1 endplate with up to approximately 30% loss of vertebral body height centrally. There is no bony retropulsion or extension into the posterior elements. The other vertebral body heights are preserved. There is no other evidence of acute fracture. Background marrow signal is normal. There is no suspicious marrow signal abnormality. Conus medullaris and cauda equina: Conus extends to the L1 level. Conus and cauda equina appear normal. Paraspinal and other soft tissues: Unremarkable. Disc levels: There is disc desiccation and narrowing at L1-L2. There is otherwise overall mild desiccation without significant loss of height throughout the remainder of the imaged spine. T12-L1: There is a shallow central protrusion without significant spinal canal or neural foraminal stenosis L1-L2: There is trace retrolisthesis with a shallow central protrusion but no significant spinal canal or neural foraminal stenosis L2-L3: Shallow  central protrusion and annular fissure but no significant spinal canal or neural foraminal stenosis L3-L4: Minimal disc bulge without significant spinal canal or neural foraminal stenosis L4-L5: Minimal disc bulge without significant spinal canal or neural foraminal stenosis L5-S1: No significant spinal canal or neural foraminal stenosis. IMPRESSION: Acute to subacute compression fracture of the superior L1 endplate with approximately 30% loss of vertebral body height centrally and no bony retropulsion. Electronically Signed   By: Valetta Mole M.D.   On: 09/17/2022 15:10    Labs:  CBC: No results for input(s): "WBC", "HGB", "HCT", "PLT" in the last 8760 hours.  COAGS: No results for input(s): "INR", "APTT" in the last 8760 hours.  BMP: No results for input(s): "NA", "K", "CL", "CO2", "GLUCOSE", "BUN", "CALCIUM", "CREATININE", "GFRNONAA", "GFRAA" in the last 8760 hours.  Invalid input(s): "CMP"  LIVER FUNCTION TESTS: No results for input(s): "BILITOT", "AST", "ALT", "ALKPHOS", "PROT", "ALBUMIN" in the last 8760 hours.  TUMOR MARKERS: No results for input(s): "AFPTM", "CEA", "CA199", "CHROMGRNA" in the last 8760 hours.  Assessment & Plan:   Kelly Keith is an appropriate candidate for L1 Kyphoplasty.  I reviewed the steps of the procedure as well as the need for moderation sedation.  Risks and benefits of the procedure were discussed with her including, but not limited to education regarding the natural healing process of compression fractures without intervention, bleeding, infection, cement migration which may cause spinal cord damage, paralysis, pulmonary embolism or even death.  All of the patient's questions were answered, patient is agreeable to proceed.  __________________________  Patient has suffered acute osteoporotic fracture of the L1 vertebra.   History and exam have demonstrated the following:  Acute/Subacute fracture by imaging dated 09/17/2022, Pain on exam concordant with  level of fracture, Failure of conservative therapy and pain refractory to narcotic pain mediation, Inability to tolerate narcotic pain medication due to side effects, and Significant disability on the [place] with 19/24 positive symptoms, reflecting significant impact/impairment of (ADLs)  ICD-10-CM Codes that Support Medical Necessity (BamBlog.de.aspx?articleId=[email or web address])  M80.08XA    Age-related osteoporosis with current pathological fracture, vertebra(e), initial encounter for fracture ___________________________  Plan:  L1 vertebral body augmentation with balloon kyphoplasty  Post-procedure disposition: outpatient [facility]  Medication holds: Aspirin  The patient has suffered a fracture of the L1 vertebral body. It is recommended that patients aged 18 years or older be evaluated for possible testing or treatment of osteoporosis. A copy of this consult report is sent to the patient's referring physician.    Total time spent on today's visit was over 40 Minutes , including both face-to-face time and non face-to-face time, personally spent on review of chart (including labs and relevant imaging), discussing further workup and treatment options, referral to specialist if needed, reviewing outside records if pertinent, answering patient questions, and coordinating care regarding L1 compression fracture as well as management strategy.    Thank you for this interesting consult.  I greatly enjoyed meeting Kelly Keith and look forward to participating in their care.  A copy of this report was sent to the requesting provider on this date.  Electronically Signed: Paula Libra Ketrick Matney, MD 10/02/2022, 3:54 PM

## 2022-10-03 ENCOUNTER — Other Ambulatory Visit: Payer: Medicare Other

## 2022-10-03 MED ORDER — ACETAMINOPHEN 10 MG/ML IV SOLN: 1000.0000 mg | Freq: Once | INTRAVENOUS | Status: AC

## 2022-10-03 NOTE — Discharge Instructions (Signed)
Kyphoplasty Post Procedure Discharge Instructions  May resume a regular diet and any medications that you routinely take (including pain medications). However, if you are taking Aspirin or an anticoagulant/blood thinner you will be told when you can resume taking these by the healthcare provider. No driving day of procedure. The day of your procedure take it easy. You may use an ice pack as needed to injection sites on back.  Ice to back 30 minutes on and 30 minutes off, as needed. May remove bandaids tomorrow after taking a shower. Replace daily with a clean bandaid until healed.  Do not lift anything heavier than a milk jug for 1-2 weeks or determined by your physician.  Follow up with your physician in 2 weeks.    Please contact our office at [phone] for the following symptoms or if you have any questions:  Fever greater than 100 degrees Increased swelling, pain, or redness at injection site. Increased back and/or leg pain New numbness or change in symptoms from before the procedure.    Thank you for visiting [facility]. 

## 2022-10-04 ENCOUNTER — Ambulatory Visit
Admission: RE | Admit: 2022-10-04 | Discharge: 2022-10-04 | Disposition: A | Discharge status: Home / Self Care | Payer: Medicare Other | Source: Ambulatory Visit | Attending: Physical Medicine and Rehabilitation | Admitting: Physical Medicine and Rehabilitation

## 2022-10-04 DIAGNOSIS — M8000XA Age-related osteoporosis with current pathological fracture, unspecified site, initial encounter for fracture: Secondary | ICD-10-CM

## 2022-10-04 HISTORY — PX: IR KYPHO LUMBAR INC FX REDUCE BONE BX UNI/BIL CANNULATION INC/IMAGING: IMG5519

## 2022-10-04 MED ORDER — CEFAZOLIN SODIUM-DEXTROSE 2-4 GM/100ML-% IV SOLN
2.0000 g | INTRAVENOUS | Status: AC
  Administered 2022-10-04: 2 g via INTRAVENOUS

## 2022-10-04 MED ORDER — SODIUM CHLORIDE 0.9 % IV SOLN: INTRAVENOUS | Status: DC

## 2022-10-04 MED ORDER — FENTANYL CITRATE PF 50 MCG/ML IJ SOSY
25.0000 ug | PREFILLED_SYRINGE | INTRAMUSCULAR | Status: DC | PRN
  Administered 2022-10-04: 50 ug via INTRAVENOUS

## 2022-10-04 MED ORDER — MIDAZOLAM HCL 2 MG/2ML IJ SOLN
1.0000 mg | INTRAMUSCULAR | Status: DC | PRN
  Administered 2022-10-04: 0.5 mg via INTRAVENOUS
  Administered 2022-10-04 (×2): 1 mg via INTRAVENOUS

## 2022-10-04 NOTE — Procedures (Signed)
Interventional Radiology Procedure Note  Procedure:  Image guided L1 compression fracture treatment with KP, via right pedicular approach   Complications: None Recommendations:  - stable to recovery - routine wound care - 1 hr recovery, plan for DC in hr - advance diet - Do not submerge for 7 days - follow up visit in 4-6 weeks post treatment  Signed,  Coleston Dirosa S. Earleen Newport, DO

## 2022-10-11 ENCOUNTER — Telehealth: Payer: Self-pay

## 2022-10-11 NOTE — Telephone Encounter (Signed)
Phone call to pt to follow up from her kyphoplasty on 10/04/22. Pt reports her sharp pain is completely gone post procedure but is still having "a little soreness and her normal back pain". Pt reports she is able to move around a little better. Pt denies any signs of infection, redness at the site, draining or fever. Pt has no complaints at this time and will be scheduled for a telephone follow up with Dr. Earleen Newport soon. Pt advised to call back if anything were to change or any concerns arise and we will arrange an in person appointment. Pt verbalized understanding.

## 2022-11-16 ENCOUNTER — Ambulatory Visit: Payer: Medicare Other | Admitting: Obstetrics & Gynecology

## 2022-11-27 ENCOUNTER — Ambulatory Visit: Payer: Medicare Other | Admitting: Obstetrics & Gynecology

## 2023-01-19 ENCOUNTER — Ambulatory Visit
Admission: EM | Admit: 2023-01-19 | Discharge: 2023-01-19 | Disposition: A | Discharge status: Home / Self Care | Payer: Medicare Other | Attending: Emergency Medicine | Admitting: Emergency Medicine

## 2023-01-19 DIAGNOSIS — R319 Hematuria, unspecified: Secondary | ICD-10-CM | POA: Insufficient documentation

## 2023-01-19 DIAGNOSIS — N390 Urinary tract infection, site not specified: Secondary | ICD-10-CM | POA: Diagnosis not present

## 2023-01-19 LAB — POCT URINALYSIS DIP (MANUAL ENTRY)
Bilirubin, UA: NEGATIVE
Glucose, UA: NEGATIVE mg/dL
Nitrite, UA: POSITIVE — AB
Protein Ur, POC: 100 mg/dL — AB
Spec Grav, UA: 1.015 (ref 1.010–1.025)
Urobilinogen, UA: 0.2 E.U./dL
pH, UA: 7 (ref 5.0–8.0)

## 2023-01-19 MED ORDER — CEPHALEXIN 500 MG PO CAPS: 500.0000 mg | ORAL_CAPSULE | Freq: Two times a day (BID) | ORAL | 0 refills | Status: AC

## 2023-01-19 NOTE — ED Provider Notes (Signed)
Kelly Keith    CSN: [id number] Arrival date & time: 01/19/23  0802      History   Chief Complaint Chief Complaint  Patient presents with   Urinary Frequency    HPI Kelly Keith is a 77 y.o. female.  Patient presents with dysuria and urinary frequency since last night.  She denies fever, abdominal pain, flank pain, or other symptoms.  No treatments at home.  Her medical history includes GERD, osteoporosis, osteoarthritis, vertigo..  The history is provided by the patient and medical records.    Past Medical History:  Diagnosis Date   Arthritis    Cancer (HCC) 2016   left lower eye lid   GERD (gastroesophageal reflux disease)    occas   Motion sickness    cars   Osteoporosis    Shoulder pain, right    Vertigo    in past    Patient Active Problem List   Diagnosis Date Noted   Dyspnea on exertion 03/07/2017   Chest pain 03/07/2017   Rotator cuff tendinitis, right 05/20/2015   Spondylosis of cervical region without myelopathy or radiculopathy 05/20/2015   Rotator cuff impingement syndrome of right shoulder 02/05/2014    Past Surgical History:  Procedure Laterality Date   ABDOMINAL HYSTERECTOMY     AMPUTATION TOE Right 1980's   right 2nd toe due to infection   BROW LIFT Bilateral 08/30/2015   Procedure: BLEPHAROPLASTY;  Surgeon: Imagene Riches, MD;  Location: [facility];  Service: Ophthalmology;  Laterality: Bilateral;   ECTROPION REPAIR Right 08/30/2015   Procedure: REPAIR OF ECTROPION;  Surgeon: Imagene Riches, MD;  Location: [facility];  Service: Ophthalmology;  Laterality: Right;   EYE SURGERY     FOOT SURGERY     IR KYPHO LUMBAR INC FX REDUCE BONE BX UNI/BIL CANNULATION INC/IMAGING  10/04/2022   IR RADIOLOGIST EVAL & MGMT  10/02/2022   PTOSIS REPAIR Bilateral 08/30/2015   Procedure: PTOSIS REPAIR;  Surgeon: Imagene Riches, MD;  Location: [facility];  Service: Ophthalmology;  Laterality: Bilateral;  eyelids   TONSILLECTOMY AND  ADENOIDECTOMY      OB History     Gravida  1   Para  1   Term      Preterm      AB      Living  1      SAB      IAB      Ectopic      Multiple      Live Births               Home Medications    Prior to Admission medications   Medication Sig Start Date End Date Taking? Authorizing Provider  cephALEXin (KEFLEX) 500 MG capsule Take 1 capsule (500 mg total) by mouth 2 (two) times daily for 5 days. 01/19/23 01/24/23 Yes Mickie Bail, NP  acetaminophen (TYLENOL) 500 MG tablet Take 500 mg by mouth every 6 (six) hours as needed.    [provider]  clobetasol cream (TEMOVATE) 0.05 % Apply 1 application topically 2 (two) times daily.    [provider]  Coenzyme Q10 (COQ10 PO) Take by mouth daily.    [provider]  ezetimibe (ZETIA) 10 MG tablet Take 1 tablet by mouth daily. 05/01/21   [provider]  fexofenadine (ALLEGRA) 180 MG tablet Take by mouth daily.     [provider]  GARLIC PO Take by mouth daily.  [provider]  Ginger, Zingiber officinalis, (GINGER PO) Take by mouth daily.    [provider]  ketoconazole (NIZORAL) 2 % cream Apply 1 application topically daily as needed.  06/26/16   [provider]  lidocaine (HM LIDOCAINE PATCH) 4 % Place 1 patch onto the skin daily.    [provider]  Lutein 6 MG CAPS Take by mouth.    [provider]  methocarbamol (ROBAXIN) 500 MG tablet Take 250 mg by mouth 4 (four) times daily.    [provider]  Milk Thistle Extract 87.5 MG CAPS Take by mouth.    [provider]  Multiple Vitamin (MULTIVITAMIN) tablet Take 1 tablet by mouth daily.    [provider]  nitrofurantoin, macrocrystal-monohydrate, (MACROBID) 100 MG capsule Take 1 capsule (100 mg total) by mouth 2 (two) times daily. Patient not taking: Reported on 10/02/2022 07/17/22   Merrilee Jansky, MD  Nutritional Supplements (CARDIO COMPLETE PO)  Take by mouth daily.    [provider]  OVER THE COUNTER MEDICATION daily. Joint advantage    [provider]  OVER THE COUNTER MEDICATION 2 (two) times daily. True osteo (bone health)    [provider]  Papaya CHEW Chew by mouth 2 (two) times daily.    [provider]  Probiotic Product (PROBIOTIC DAILY PO) Take by mouth daily.    [provider]  Turmeric POWD Take by mouth daily.     [provider]    Family History Family History  Problem Relation Age of Onset   Heart disease Mother    Arthritis Mother    Anxiety disorder Mother    Osteoporosis Mother    Lung cancer Father    Congestive Heart Failure Father    High blood pressure Father     Social History Social History   Tobacco Use   Smoking status: Never   Smokeless tobacco: Never  Vaping Use   Vaping Use: Never used  Substance Use Topics   Alcohol use: No    Alcohol/week: 0.0 standard drinks of alcohol   Drug use: Never     Allergies   Nsaids, Rosuvastatin calcium, and Septra [sulfamethoxazole-trimethoprim]   Review of Systems Review of Systems  Constitutional:  Negative for chills and fever.  Gastrointestinal:  Negative for abdominal pain, nausea and vomiting.  Genitourinary:  Positive for dysuria and frequency. Negative for flank pain and hematuria.     Physical Exam Triage Vital Signs ED Triage Vitals  Enc Vitals Group     BP      Pulse      Resp      Temp      Temp src      SpO2      Weight      Height      Head Circumference      Peak Flow      Pain Score      Pain Loc      Pain Edu?      Excl. in GC?    No data found.  Updated Vital Signs BP (!) 147/84   Pulse 69   Temp 98.1 F (36.7 C)   Resp 17   LMP  (LMP Unknown)   SpO2 98%   Visual Acuity Right Eye Distance:   Left Eye Distance:   Bilateral Distance:    Right Eye Near:   Left Eye Near:    Bilateral Near:     Physical Exam Vitals  and nursing note reviewed.   Constitutional:      General: She is not in acute distress.    Appearance: She is well-developed.  HENT:     Mouth/Throat:     Mouth: Mucous membranes are moist.  Cardiovascular:     Rate and Rhythm: Normal rate and regular rhythm.     Heart sounds: Normal heart sounds.  Pulmonary:     Effort: Pulmonary effort is normal. No respiratory distress.     Breath sounds: Normal breath sounds.  Abdominal:     General: Bowel sounds are normal.     Palpations: Abdomen is soft.     Tenderness: There is no abdominal tenderness. There is no right CVA tenderness, left CVA tenderness, guarding or rebound.  Musculoskeletal:     Cervical back: Neck supple.  Skin:    General: Skin is warm and dry.  Neurological:     Mental Status: She is alert.  Psychiatric:        Mood and Affect: Mood normal.        Behavior: Behavior normal.      UC Treatments / Results  Labs (all labs ordered are listed, but only abnormal results are displayed) Labs Reviewed  POCT URINALYSIS DIP (MANUAL ENTRY) - Abnormal; Notable for the following components:      Result Value   Clarity, UA cloudy (*)    Ketones, POC UA trace (5) (*)    Blood, UA large (*)    Protein Ur, POC =100 (*)    Nitrite, UA Positive (*)    Leukocytes, UA Large (3+) (*)    All other components within normal limits  URINE CULTURE    EKG   Radiology No results found.  Procedures Procedures (including critical care time)  Medications Ordered in UC Medications - No data to display  Initial Impression / Assessment and Plan / UC Course  I have reviewed the triage vital signs and the nursing notes.  Pertinent labs & imaging results that were available during my care of the patient were reviewed by me and considered in my medical decision making (see chart for details).    UTI with hematuria.  Treating with Keflex. Urine culture pending. Discussed with patient that we will call her if the urine culture shows the need to change or  discontinue the antibiotic. Instructed her to follow-up with her PCP if her symptoms are not improving. Patient agrees to plan of care.     Final Clinical Impressions(s) / UC Diagnoses   Final diagnoses:  Urinary tract infection with hematuria, site unspecified     Discharge Instructions      Take the antibiotic as directed.  The urine culture is pending.  We will call you if it shows the need to change or discontinue your antibiotic.    Follow up with your primary care provider if your symptoms are not improving.        ED Prescriptions     Medication Sig Dispense Auth. Provider   cephALEXin (KEFLEX) 500 MG capsule Take 1 capsule (500 mg total) by mouth 2 (two) times daily for 5 days. 10 capsule Mickie Bail, NP      PDMP not reviewed this encounter.   Mickie Bail, NP 01/19/23 [id number]

## 2023-01-19 NOTE — Discharge Instructions (Signed)
Take the antibiotic as directed.  The urine culture is pending.  We will call you if it shows the need to change or discontinue your antibiotic.    Follow up with your primary care provider if your symptoms are not improving.    

## 2023-01-19 NOTE — ED Triage Notes (Signed)
Patient to Urgent Care with complaints of urinary symptoms that started this occurred over night. Endorses urinary frequency, urgency, and dysuria.  Denies any vaginal discharge or fevers.

## 2023-01-21 LAB — URINE CULTURE: Culture: >=100000 — AB

## 2023-05-18 ENCOUNTER — Ambulatory Visit
Admission: EM | Admit: 2023-05-18 | Discharge: 2023-05-18 | Disposition: A | Discharge status: Home / Self Care | Payer: Medicare Other | Attending: Emergency Medicine | Admitting: Emergency Medicine

## 2023-05-18 DIAGNOSIS — N3000 Acute cystitis without hematuria: Secondary | ICD-10-CM | POA: Insufficient documentation

## 2023-05-18 LAB — POCT URINALYSIS DIP (MANUAL ENTRY)
Bilirubin, UA: NEGATIVE
Glucose, UA: NEGATIVE mg/dL
Nitrite, UA: POSITIVE — AB
Protein Ur, POC: 30 mg/dL — AB
Spec Grav, UA: 1.015 (ref 1.010–1.025)
Urobilinogen, UA: 0.2 U/dL
pH, UA: 7 (ref 5.0–8.0)

## 2023-05-18 MED ORDER — CEPHALEXIN 500 MG PO CAPS: 500.0000 mg | ORAL_CAPSULE | Freq: Two times a day (BID) | ORAL | 0 refills | Status: AC

## 2023-05-18 NOTE — ED Provider Notes (Signed)
Renaldo Fiddler    CSN: [id number] Arrival date & time: 05/18/23  8756      History   Chief Complaint Chief Complaint  Patient presents with   Urinary Frequency    HPI Kelly Keith is a 77 y.o. female.   Patient presents for evaluation of urinary frequency and dysuria, lower abdominal pressure only with urination beginning this morning.  Took home urinary test which showed positive for UTI.  History of reoccurring infections.  Denies hematuria, flank pain, fever or vaginal symptoms.  Has not attempted treatment.  Past Medical History:  Diagnosis Date   Arthritis    Cancer (HCC) 2016   left lower eye lid   GERD (gastroesophageal reflux disease)    occas   Motion sickness    cars   Osteoporosis    Shoulder pain, right    Vertigo    in past    Patient Active Problem List   Diagnosis Date Noted   Dyspnea on exertion 03/07/2017   Chest pain 03/07/2017   Rotator cuff tendinitis, right 05/20/2015   Spondylosis of cervical region without myelopathy or radiculopathy 05/20/2015   Rotator cuff impingement syndrome of right shoulder 02/05/2014    Past Surgical History:  Procedure Laterality Date   ABDOMINAL HYSTERECTOMY     AMPUTATION TOE Right 1980's   right 2nd toe due to infection   BROW LIFT Bilateral 08/30/2015   Procedure: BLEPHAROPLASTY;  Surgeon: Imagene Riches, MD;  Location: [facility];  Service: Ophthalmology;  Laterality: Bilateral;   ECTROPION REPAIR Right 08/30/2015   Procedure: REPAIR OF ECTROPION;  Surgeon: Imagene Riches, MD;  Location: [facility];  Service: Ophthalmology;  Laterality: Right;   EYE SURGERY     FOOT SURGERY     IR KYPHO LUMBAR INC FX REDUCE BONE BX UNI/BIL CANNULATION INC/IMAGING  10/04/2022   IR RADIOLOGIST EVAL & MGMT  10/02/2022   PTOSIS REPAIR Bilateral 08/30/2015   Procedure: PTOSIS REPAIR;  Surgeon: Imagene Riches, MD;  Location: [facility];  Service: Ophthalmology;  Laterality: Bilateral;  eyelids    TONSILLECTOMY AND ADENOIDECTOMY      OB History     Gravida  1   Para  1   Term      Preterm      AB      Living  1      SAB      IAB      Ectopic      Multiple      Live Births               Home Medications    Prior to Admission medications   Medication Sig Start Date End Date Taking? Authorizing Provider  clobetasol cream (TEMOVATE) 0.05 % Apply 1 application topically 2 (two) times daily.   Yes [provider]  Coenzyme Q10 (COQ10 PO) Take by mouth daily.   Yes [provider]  ezetimibe (ZETIA) 10 MG tablet Take 1 tablet by mouth daily. 05/01/21  Yes [provider]  fexofenadine (ALLEGRA) 180 MG tablet Take by mouth daily.    Yes [provider]  GARLIC PO Take by mouth daily.   Yes [provider]  Ginger, Zingiber officinalis, (GINGER PO) Take by mouth daily.   Yes [provider]  lidocaine (HM LIDOCAINE PATCH) 4 % Place 1 patch onto the skin daily.   Yes [provider]  Lutein 6 MG CAPS Take by mouth.   Yes  [provider]  methocarbamol (ROBAXIN) 500 MG tablet Take 250 mg by mouth 4 (four) times daily.   Yes [provider]  Milk Thistle Extract 87.5 MG CAPS Take by mouth.   Yes [provider]  Multiple Vitamin (MULTIVITAMIN) tablet Take 1 tablet by mouth daily.   Yes [provider]  Nutritional Supplements (CARDIO COMPLETE PO) Take by mouth daily.   Yes [provider]  OVER THE COUNTER MEDICATION daily. Joint advantage   Yes [provider]  OVER THE COUNTER MEDICATION 2 (two) times daily. True osteo (bone health)   Yes [provider]  Papaya CHEW Chew by mouth 2 (two) times daily.   Yes [provider]  Probiotic Product (PROBIOTIC DAILY PO) Take by mouth daily.   Yes [provider]  Turmeric POWD Take by mouth daily.    Yes [provider]  acetaminophen (TYLENOL) 500 MG tablet Take 500 mg  by mouth every 6 (six) hours as needed.    [provider]  cephALEXin (KEFLEX) 500 MG capsule Take 1 capsule (500 mg total) by mouth 2 (two) times daily for 5 days. 05/18/23 05/23/23 Yes Deanthony Maull R, NP  ketoconazole (NIZORAL) 2 % cream Apply 1 application topically daily as needed.  06/26/16   [provider]  nitrofurantoin, macrocrystal-monohydrate, (MACROBID) 100 MG capsule Take 1 capsule (100 mg total) by mouth 2 (two) times daily. Patient not taking: Reported on 10/02/2022 07/17/22   Merrilee Jansky, MD    Family History Family History  Problem Relation Age of Onset   Heart disease Mother    Arthritis Mother    Anxiety disorder Mother    Osteoporosis Mother    Lung cancer Father    Congestive Heart Failure Father    High blood pressure Father     Social History Social History   Tobacco Use   Smoking status: Never   Smokeless tobacco: Never  Vaping Use   Vaping status: Never Used  Substance Use Topics   Alcohol use: No    Alcohol/week: 0.0 standard drinks of alcohol   Drug use: Never     Allergies   Nsaids, Rosuvastatin calcium, and Septra [sulfamethoxazole-trimethoprim]   Review of Systems Review of Systems   Physical Exam Triage Vital Signs ED Triage Vitals  Encounter Vitals Group     BP 05/18/23 1057 (!) 147/78     Systolic BP Percentile --      Diastolic BP Percentile --      Pulse Rate 05/18/23 1057 65     Resp 05/18/23 1057 18     Temp 05/18/23 1057 (!) 97.5 F (36.4 C)     Temp Source 05/18/23 1057 Tympanic     SpO2 05/18/23 1057 100 %     Weight --      Height --      Head Circumference --      Peak Flow --      Pain Score 05/18/23 1100 0     Pain Loc --      Pain Education --      Exclude from Growth Chart --    No data found.  Updated Vital Signs BP (!) 147/78 (BP Location: Left Arm)   Pulse 65   Temp (!) 97.5 F (36.4 C) (Tympanic)   Resp 18   LMP  (LMP Unknown)   SpO2 100%   Visual Acuity Right Eye  Distance:   Left Eye Distance:   Bilateral Distance:  Right Eye Near:   Left Eye Near:    Bilateral Near:     Physical Exam Constitutional:      Appearance: Normal appearance.  Eyes:     Extraocular Movements: Extraocular movements intact.  Pulmonary:     Effort: Pulmonary effort is normal.  Genitourinary:    Comments: Deferred  Neurological:     Mental Status: She is alert and oriented to person, place, and time. Mental status is at baseline.      UC Treatments / Results  Labs (all labs ordered are listed, but only abnormal results are displayed) Labs Reviewed  POCT URINALYSIS DIP (MANUAL ENTRY) - Abnormal; Notable for the following components:      Result Value   Clarity, UA cloudy (*)    Ketones, POC UA trace (5) (*)    Blood, UA small (*)    Protein Ur, POC =30 (*)    Nitrite, UA Positive (*)    Leukocytes, UA Large (3+) (*)    All other components within normal limits  URINE CULTURE    EKG   Radiology No results found.  Procedures Procedures (including critical care time)  Medications Ordered in UC Medications - No data to display  Initial Impression / Assessment and Plan / UC Course  I have reviewed the triage vital signs and the nursing notes.  Pertinent labs & imaging results that were available during my care of the patient were reviewed by me and considered in my medical decision making (see chart for details).  Acute cystitis without hematuria   Urinalysis showing leukocytes and nitrates, sent for culture, discussed findings with patient, cephalexin prescribed, last culture available June 2024 showing E. coli, recommended additional supportive measures and advised follow-up if symptoms persist worsen or recur Final Clinical Impressions(s) / UC Diagnoses   Final diagnoses:  Acute cystitis without hematuria     Discharge Instructions      Your urinalysis shows Tadeusz Stahl blood cells and nitrates which are indicative of infection, your urine  will be sent to the lab to determine exactly which bacteria is present, if any changes need to be made to your medications you will be notified  Begin use of cephalexin every morning and every evening for 5 days  You may use over-the-counter Pyridium (Azo, Cystex) to help minimize your symptoms until antibiotic removes bacteria, this medication will turn your urine orange  Increase your fluid intake through use of water  As always practice good hygiene, wiping front to back and avoidance of scented vaginal products to prevent further irritation  If symptoms continue to persist after use of medication or recur please follow-up with urgent care or your primary doctor as needed    ED Prescriptions     Medication Sig Dispense Auth. Provider   cephALEXin (KEFLEX) 500 MG capsule Take 1 capsule (500 mg total) by mouth 2 (two) times daily for 5 days. 10 capsule Valinda Hoar, NP      PDMP not reviewed this encounter.   Valinda Hoar, NP 05/18/23 1201

## 2023-05-18 NOTE — ED Triage Notes (Signed)
Pt presents with urinary frequency, burning with urination that started today. Pt states she did a at home test strip for uti and it was positive.

## 2023-05-18 NOTE — Discharge Instructions (Addendum)
Your urinalysis shows Kelly Keith blood cells and nitrates which are indicative of infection, your urine will be sent to the lab to determine exactly which bacteria is present, if any changes need to be made to your medications you will be notified  Begin use of cephalexin every morning and every evening for 5 days  You may use over-the-counter Pyridium (Azo, Cystex) to help minimize your symptoms until antibiotic removes bacteria, this medication will turn your urine orange  Increase your fluid intake through use of water  As always practice good hygiene, wiping front to back and avoidance of scented vaginal products to prevent further irritation  If symptoms continue to persist after use of medication or recur please follow-up with urgent care or your primary doctor as needed

## 2023-05-20 LAB — URINE CULTURE: Culture: >=100000 — AB
